# Patient Record
Sex: Male | Born: 1995 | Race: White | Hispanic: No | Marital: Single | Smoking: Former smoker
Health system: Southern US, Community
[De-identification: ages and names within clinical notes are randomized; demographics above are authoritative.]

## PROBLEM LIST (undated history)

## (undated) DIAGNOSIS — R1083 Colic: Secondary | ICD-10-CM

## (undated) DIAGNOSIS — J302 Other seasonal allergic rhinitis: Secondary | ICD-10-CM

## (undated) DIAGNOSIS — K922 Gastrointestinal hemorrhage, unspecified: Secondary | ICD-10-CM

## (undated) DIAGNOSIS — F329 Major depressive disorder, single episode, unspecified: Secondary | ICD-10-CM

## (undated) DIAGNOSIS — F419 Anxiety disorder, unspecified: Secondary | ICD-10-CM

## (undated) DIAGNOSIS — R51 Headache: Secondary | ICD-10-CM

## (undated) DIAGNOSIS — J42 Unspecified chronic bronchitis: Secondary | ICD-10-CM

## (undated) DIAGNOSIS — F32A Depression, unspecified: Secondary | ICD-10-CM

## (undated) HISTORY — DX: Other seasonal allergic rhinitis: J30.2

## (undated) HISTORY — DX: Major depressive disorder, single episode, unspecified: F32.9

## (undated) HISTORY — DX: Colic: R10.83

## (undated) HISTORY — DX: Gastrointestinal hemorrhage, unspecified: K92.2

## (undated) HISTORY — DX: Anxiety disorder, unspecified: F41.9

## (undated) HISTORY — DX: Depression, unspecified: F32.A

## (undated) HISTORY — DX: Unspecified chronic bronchitis: J42

## (undated) HISTORY — DX: Headache: R51

---
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
## 2016-12-11 ENCOUNTER — Ambulatory Visit (INDEPENDENT_AMBULATORY_CARE_PROVIDER_SITE_OTHER): Payer: 59 | Admitting: Internal Medicine

## 2016-12-11 ENCOUNTER — Encounter (INDEPENDENT_AMBULATORY_CARE_PROVIDER_SITE_OTHER): Payer: Self-pay | Admitting: Internal Medicine

## 2016-12-11 VITALS — BP 116/62 | HR 72 | Temp 98.0°F | Ht 70.0 in | Wt 210.0 lb

## 2016-12-11 DIAGNOSIS — R635 Abnormal weight gain: Secondary | ICD-10-CM

## 2016-12-11 DIAGNOSIS — K625 Hemorrhage of anus and rectum: Secondary | ICD-10-CM

## 2016-12-11 LAB — CBC WITH DIFFERENTIAL/PLATELET
Basophils Absolute: 52 cells/uL (ref 0–200)
Basophils Relative: 1 %
EOS ABS: 208 {cells}/uL (ref 15–500)
Eosinophils Relative: 4 %
HCT: 42 % (ref 38.5–50.0)
HEMOGLOBIN: 14.3 g/dL (ref 13.2–17.1)
Lymphocytes Relative: 36 %
Lymphs Abs: 1872 cells/uL (ref 850–3900)
MCH: 30.6 pg (ref 27.0–33.0)
MCHC: 34 g/dL (ref 32.0–36.0)
MCV: 89.7 fL (ref 80.0–100.0)
MPV: 9.5 fL (ref 7.5–12.5)
Monocytes Absolute: 468 cells/uL (ref 200–950)
Monocytes Relative: 9 %
NEUTROS ABS: 2600 {cells}/uL (ref 1500–7800)
Neutrophils Relative %: 50 %
Platelets: 288 10*3/uL (ref 140–400)
RBC: 4.68 MIL/uL (ref 4.20–5.80)
RDW: 13.5 % (ref 11.0–15.0)
WBC: 5.2 10*3/uL (ref 3.8–10.8)

## 2016-12-11 LAB — TSH: TSH: 1.16 mIU/L (ref 0.50–4.30)

## 2016-12-11 NOTE — Patient Instructions (Signed)
3 stool cards home with patient. CBC today 

## 2016-12-11 NOTE — Progress Notes (Addendum)
   Subjective:    Patient ID: Eddie Gutierrez, male    DOB: [birth year], 21 y.o.   MRN: [id number] Wt in 2016 182 HPI Presents today with c/o rectal bleeding. Last seen by Dr. Karilyn Cota in October 2016 Presently being treated for an inflamed prostate.  He tells me every now and then he sees blood. States he see blood about once a week. About two months ago he saw red streaks in his stool. This has been occurring for about 2 months. He has a BM x 2 a day. Has not had to strain to have a BM.  Appetite is good. No weight loss. He has gained from 182 to 210 since 2016. Grandfather had colon cancer at age in his 45s. H. Pylori negative in 2016.  Review of Systems Past Medical History:  Diagnosis Date  . Anxiety   . Bronchitis, chronic (HCC)   . Colic in infants    first 3 months  . Depression    Depressive D/O Nos  . GI bleed    presumed PUD  . Headache(784.0)   . Low birth weight in full term infant with weight unknown   . Seasonal allergies     No past surgical history on file.  No Known Allergies  Current Outpatient Prescriptions on File Prior to Visit  Medication Sig Dispense Refill  . Cetirizine HCl (ZYRTEC ALLERGY PO) Take 1 tablet by mouth.       No current facility-administered medications on file prior to visit.        Objective:   Physical Exam Blood pressure 116/62, pulse 72, temperature 98 F (36.7 C), height  (1.778 m), weight 210 lb (95.3 kg). Alert and oriented. Skin warm and dry. Oral mucosa is moist.   . Sclera anicteric, conjunctivae is pink. Thyroid not enlarged. No cervical lymphadenopathy. Lungs clear. Heart regular rate and rhythm.  Abdomen is soft. Bowel sounds are positive. No hepatomegaly. No abdominal masses felt. No tenderness.  No edema to lower extremities.  Stool brown and guaiac negative.        Assessment & Plan:  Rectal bleeding.  3 stool cards home with patient. CBC  Weight gain: TSH at mother's request.

## 2016-12-29 ENCOUNTER — Telehealth (INDEPENDENT_AMBULATORY_CARE_PROVIDER_SITE_OTHER): Payer: Self-pay | Admitting: *Deleted

## 2016-12-29 NOTE — Telephone Encounter (Signed)
He will come by office and pick up 3 more stool cards

## 2016-12-29 NOTE — Telephone Encounter (Signed)
   Diagnosis:    Result(s)   Card 1:  Negative:     Card 2: Negative:   Card 3: Negative:  Patient was given results in office. He states thatwhen collecting stools he saw blood.    Completed by: Larose Hiresammy Tysheem Accardo , LPN   HEMOCCULT SENSA DEVELOPER: LOT#: [id number]   EXPIRATION DATE: 2020-05   HEMOCCULT SENSA CARD:  ZOX#:[id number]   EXPIRATION DATE: 05/20   CARD CONTROL RESULTS:  POSITIVE: Positive  NEGATIVE: Negative    ADDITIONAL COMMENTS: Forwarded to Terri for review.

## 2017-01-13 ENCOUNTER — Telehealth (INDEPENDENT_AMBULATORY_CARE_PROVIDER_SITE_OTHER): Payer: Self-pay | Admitting: *Deleted

## 2017-01-13 ENCOUNTER — Telehealth (INDEPENDENT_AMBULATORY_CARE_PROVIDER_SITE_OTHER): Payer: Self-pay | Admitting: Internal Medicine

## 2017-01-13 DIAGNOSIS — K625 Hemorrhage of anus and rectum: Secondary | ICD-10-CM

## 2017-01-13 DIAGNOSIS — K6289 Other specified diseases of anus and rectum: Secondary | ICD-10-CM

## 2017-01-13 MED ORDER — HYDROCORTISONE ACE-PRAMOXINE 1-1 % RE FOAM: 1.0000 | Freq: Two times a day (BID) | RECTAL | 0 refills | Status: DC

## 2017-01-13 NOTE — Telephone Encounter (Signed)
I called an Rx for Proctofoam. He will call me in about 2 weeks and let me know how he is doing

## 2017-01-13 NOTE — Telephone Encounter (Signed)
   Diagnosis:    Result(s)   Card 1: Positive: 01/07/2017     Card 2: Negative:      Completed by: Caliya Narine,LPN  HEMOCCULT SENSA DEVELOPER: EAV#:[id number]   EXPIRATION DATE: 2020-05   HEMOCCULT SENSA CARD:  BJY#:[id number]   EXPIRATION DATE: 05/20   CARD CONTROL RESULTS:  POSITIVE:Positive  NEGATIVE: Negative    ADDITIONAL COMMENTS: Patient states that every other BM he feels a scratch on the left side. Not painful. He was made aware of results. Per Camelia Engerri going to review with Dr.Rehman and call patient.

## 2017-01-13 NOTE — Telephone Encounter (Signed)
Am going to send an Rx in for proctofoam. I did not feel a mass when I did the rectal. This area on the left could be a fissure or hemorrhoid. I also advised him to take a stool softener. He will call back in about 2 weeks and let me know how he is doing.

## 2017-01-16 NOTE — Telephone Encounter (Signed)
If he has any more episode of rectal bleeding will consider flexible sigmoidoscopy.

## 2017-04-16 ENCOUNTER — Ambulatory Visit: Payer: Self-pay | Admitting: General Surgery

## 2017-04-21 ENCOUNTER — Encounter: Payer: Self-pay | Admitting: General Surgery

## 2017-04-21 ENCOUNTER — Ambulatory Visit (INDEPENDENT_AMBULATORY_CARE_PROVIDER_SITE_OTHER): Payer: Self-pay | Admitting: General Surgery

## 2017-04-21 VITALS — BP 124/78 | HR 87 | Temp 98.9°F | Resp 18 | Ht 70.0 in | Wt 215.0 lb

## 2017-04-21 DIAGNOSIS — K4090 Unilateral inguinal hernia, without obstruction or gangrene, not specified as recurrent: Secondary | ICD-10-CM

## 2017-04-21 NOTE — Patient Instructions (Signed)

## 2017-04-21 NOTE — Progress Notes (Signed)
Eddie Gutierrez; [id number]; 05/03/1996   HPI Patient is a 21 year old white male who was referred to my care by Dr. Phillips Odor for evaluation and treatment of right groin pain.  He states he was starting to work out approximately 1 month ago and he had an acute onset of right groin pain.  He was seen by his primary care physician and was found to have a right inguinal hernia.  Since that time, he has not had any additional right groin pain.  He currently has 0 pain.  He has lightened his workout.  He denies nausea, vomiting, or swelling in the right groin region. Past Medical History:  Diagnosis Date  . Anxiety   . Bronchitis, chronic (HCC)   . Colic in infants    first 3 months  . Depression    Depressive D/O Nos  . GI bleed    presumed PUD  . Headache(784.0)   . Low birth weight in full term infant with weight unknown   . Seasonal allergies     History reviewed. No pertinent surgical history.  Family History  Problem Relation Age of Onset  . Depression Mother        postpartum depression after births of her two children  . ADD / ADHD Mother   . Depression Maternal Grandmother        takes medication  . ADD / ADHD Father   . OCD Father   . ADD / ADHD Brother   . Alcohol abuse Neg Hx   . Drug abuse Neg Hx   . Anxiety disorder Neg Hx   . Bipolar disorder Neg Hx   . Dementia Neg Hx   . Paranoid behavior Neg Hx   . Schizophrenia Neg Hx   . Seizures Neg Hx   . Sexual abuse Neg Hx   . Physical abuse Neg Hx   . Inflammatory bowel disease Neg Hx   . Colon polyps Neg Hx     Current Outpatient Prescriptions on File Prior to Visit  Medication Sig Dispense Refill  . Cetirizine HCl (ZYRTEC ALLERGY PO) Take 1 tablet by mouth.      . doxycycline (DORYX) 100 MG EC tablet Take 100 mg by mouth 2 (two) times daily.    . hydrocortisone-pramoxine (PROCTOFOAM HC) rectal foam Place 1 applicator rectally 2 (two) times daily. 10 g 0  . meloxicam (MOBIC) 15 MG tablet Take 15 mg by mouth daily.      No current facility-administered medications on file prior to visit.     No Known Allergies  History  Alcohol Use No    History  Smoking Status  . Never Smoker  Smokeless Tobacco  . Former Neurosurgeon  . Quit date: 03/25/2012    Review of Systems  Constitutional: Negative.   HENT: Negative.   Eyes: Negative.   Respiratory: Negative.   Cardiovascular: Negative.   Gastrointestinal: Negative.   Genitourinary: Negative.   Musculoskeletal: Negative.   Skin: Negative.   Neurological: Negative.   Endo/Heme/Allergies: Negative.   Psychiatric/Behavioral: Negative.     Objective   Vitals:   04/21/17 1216  BP: 124/78  Pulse: 87  Resp: 18  Temp: 98.9 F (37.2 C)    Physical Exam  Constitutional: He is oriented to person, place, and time and well-developed, well-nourished, and in no distress.  HENT:  Head: Normocephalic and atraumatic.  Cardiovascular: Normal rate, regular rhythm and normal heart sounds.  Exam reveals no gallop and no friction rub.   No murmur heard. Pulmonary/Chest:  Effort normal and breath sounds normal. No respiratory distress. He has no wheezes. He has no rales.  Abdominal: Soft. Bowel sounds are normal. He exhibits no distension. There is no tenderness. There is no rebound.  Laxity in the right groin region noted.   I could barely detect a right inguinal hernia while patient was straining.  Neurological: He is alert and oriented to person, place, and time.  Skin: Skin is warm and dry.  Vitals reviewed.   Assessment  Right inguinal hernia, asymptomatic Plan   No need for surgical intervention at this time.  Paitent understands and agrees.  Follow up prn.

## 2018-03-31 ENCOUNTER — Ambulatory Visit (INDEPENDENT_AMBULATORY_CARE_PROVIDER_SITE_OTHER): Payer: Self-pay | Admitting: Internal Medicine

## 2018-03-31 ENCOUNTER — Other Ambulatory Visit (INDEPENDENT_AMBULATORY_CARE_PROVIDER_SITE_OTHER): Payer: Self-pay | Admitting: Internal Medicine

## 2018-03-31 ENCOUNTER — Encounter (INDEPENDENT_AMBULATORY_CARE_PROVIDER_SITE_OTHER): Payer: Self-pay | Admitting: Internal Medicine

## 2018-03-31 DIAGNOSIS — K625 Hemorrhage of anus and rectum: Secondary | ICD-10-CM | POA: Insufficient documentation

## 2018-03-31 NOTE — Progress Notes (Signed)
   Subjective:    Patient ID: Eddie Gutierrez, male    DOB: 03/12/1996, 22 y.o.   MRN: [id number]  HPI Here today for f/u. Prior hx of rectal bleeding.  Seen in April of last year for rectal bleeding.  Had been seeing blood for about 2 months.  Family hx of colon cancer in a grandfather in his 7460s. Two stools card were sent home with him and one was positive. . Per Dr. Dr. Patty Sermonsehman's telephone encounter, if patient has another episode of rectal bleeding, will consider flexible sigmoid.  He presents today stating he sees blood with his BMs. Does not occur daily. Occurs every 4th stool. Sees blood on the toilet tissue. Bleeding started a couple of months ago. With his BMs, has some tenderness in his rectum. Marland Kitchen.  He does see blood on one side of the stool in the toilet.  Appetite is okay. No weight loss.      Review of Systems Past Medical History:  Diagnosis Date  . Anxiety   . Bronchitis, chronic (HCC)   . Colic in infants    first 3 months  . Depression    Depressive D/O Nos  . GI bleed    presumed PUD  . Headache(784.0)   . Low birth weight in full term infant with weight unknown   . Seasonal allergies     History reviewed. No pertinent surgical history.  No Known Allergies  No current outpatient medications on file prior to visit.   No current facility-administered medications on file prior to visit.         Objective:   Physical Exam Blood pressure 120/82, pulse 80, temperature 97.6 F (36.4 C), height 5\' 9"  (1.753 m), weight 211 lb 11.2 oz (96 kg). Alert and oriented. Skin warm and dry. Oral mucosa is moist.   . Sclera anicteric, conjunctivae is pink. Thyroid not enlarged. No cervical lymphadenopathy. Lungs clear. Heart regular rate and rhythm.  Abdomen is soft. Bowel sounds are positive. No hepatomegaly. No abdominal masses felt. No tenderness.  No edema to lower extremities.          Assessment & Plan:  Rectal bleeding. Needs flex sigmoid to rule out hemorrhoid,  polyps, fissure and less likely colon cancer.

## 2018-03-31 NOTE — Patient Instructions (Signed)
The risks of bleeding, perforation and infection were reviewed with patient.  

## 2018-04-01 ENCOUNTER — Encounter (INDEPENDENT_AMBULATORY_CARE_PROVIDER_SITE_OTHER): Payer: Self-pay | Admitting: *Deleted

## 2018-04-01 NOTE — Patient Instructions (Signed)
Eddie Gutierrez  04/01/2018     @PREFPERIOPPHARMACY @   Your procedure is scheduled on  04/09/2018   Report to [facility] at  1030   A.M.  Call this number if you have problems the morning of surgery:  [phone]   Remember:  Do not eat or drink after midnight.  You may drink clear liquids until (follow the instructions given to you).  Clear liquids allowed are:                    Water, Juice (non-citric and without pulp), Carbonated beverages, Clear Tea, Black Coffee only, Plain Jell-O only, Gatorade and Plain Popsicles only    Take these medicines the morning of surgery with A SIP OF WATER  None    Do not wear jewelry, make-up or nail polish.  Do not wear lotions, powders, or perfumes, or deodorant.  Do not shave 48 hours prior to surgery.  Men may shave face and neck.  Do not bring valuables to the hospital.  [facility] is not responsible for any belongings or valuables.  Contacts, dentures or bridgework may not be worn into surgery.  Leave your suitcase in the car.  After surgery it may be brought to your room.  For patients admitted to the hospital, discharge time will be determined by your treatment team.  Patients discharged the day of surgery will not be allowed to drive home.   Name and phone number of your driver:   family Special instructions:  Follow the diet and prep instructions given to you by Dr Patty Sermons office.  Please read over the following fact sheets that you were given. Anesthesia Post-op Instructions and Care and Recovery After Surgery       Flexible Sigmoidoscopy Flexible sigmoidoscopy is a procedure to check the lower colon (sigmoid colon). The procedure is done using a short, flexible tube that has a small camera attached (sigmoidoscope). The sigmoidoscope is inserted into the anus and passed through the rectum into the sigmoid colon. The camera on the scope sends images to a TV monitor in the exam room. You may need this exam if  you have had changes in your bowel habits, bleeding from your rectum, or pain in your abdomen. You may also need this test if your health care provider wants to check for abnormal growths in your rectum or lower colon. Tell a health care provider about:  Any allergies you have.  All medicines you are taking, including vitamins, herbs, eye drops, creams, and over-the-counter medicines.  Any problems you or family members have had with anesthetic medicines.  Any blood disorders you have.  Any surgeries you have had.  Any medical conditions you have.  Whether you are pregnant or may be pregnant. What are the risks? Generally, this is a safe procedure. However, problems may occur, including:  Abdominal pain.  Bleeding.  Infection or inflammation in your colon.  A tear through your rectum or colon (perforation).  Allergic reactions to medicines.  Damage to other structures or organs.  What happens before the procedure?  Follow instructions from your health care provider about eating and drinking restrictions.  Ask your health care provider about: ? Changing or stopping your regular medicines. This is especially important if you are taking diabetes medicines or blood thinners. ? Taking medicines such as aspirin and ibuprofen. These medicines can thin your blood. Do not take these medicines before your procedure if your  health care provider instructs you not to.  Plan to have someone take you home from the hospital or clinic.  If you will be going home right after the procedure, plan to have someone with you for 24 hours.  You will need to follow a specific diet and use a laxative or an enema before the procedure (bowel prep). This is to clean out your colon. The bowel prep is often done the night before the procedure or the morning of the procedure. Follow instructions exactly as given by your health care provider.  You may need to have a rectal suppository or enema in the  morning before your procedure. What happens during the procedure?  An IV tube may be inserted into one of your veins.  You may be given a medicine to help you relax (sedative).  You will lie on your side on the exam table. You may be asked to change positions during the procedure.  The sigmoidoscope will be lubricated and gently inserted into your anus.  Air will be injected into your colon, and the sigmoidoscope will be moved into your sigmoid colon. You may feel some pressure or cramping.  Your health care provider will check the monitor for any abnormal findings.  Your health care provider may take a small piece of tissue to check under a microscope (biopsy).  The scope will be slowly removed. The procedure may vary among health care providers and hospitals. What happens after the procedure?  Your blood pressure, heart rate, breathing rate, and blood oxygen level will be monitored until the medicines you were given have worn off.  Do not drive for 24 hours if you received a sedative. This information is not intended to replace advice given to you by your health care provider. Make sure you discuss any questions you have with your health care provider. Document Released: 08/08/2000 Document Revised: 02/29/2016 Document Reviewed: 11/10/2015 Elsevier Interactive Patient Education  2018 [employer].  Flexible Sigmoidoscopy, Care After This sheet gives you information about how to care for yourself after your procedure. Your health care provider may also give you more specific instructions. If you have problems or questions, contact your health care provider. What can I expect after the procedure? After the procedure, it is common to have:  Abdominal cramping or pain.  Bloating.  A small amount of rectal bleeding if you had a biopsy.  Follow these instructions at home:  Take over-the-counter and prescription medicines only as told by your health care provider.  Do not drive  for 24 hours if you received a medicine to help you relax (sedative).  Keep all follow-up visits as told by your health care provider. This is important. Contact a health care provider if:  You have abdominal pain or cramping that gets worse or is not helped with medicine.  You continue to have small amounts of rectal bleeding after 24 hours.  You have nausea or vomiting.  You feel weak or dizzy.  You have a fever. Get help right away if:  You pass large blood clots or see a large amount of blood in the toilet after having a bowel movement.  You have nausea or vomiting for more than 24 hours after the procedure. This information is not intended to replace advice given to you by your health care provider. Make sure you discuss any questions you have with your health care provider. Document Released: 08/16/2013 Document Revised: 02/29/2016 Document Reviewed: 11/10/2015 Elsevier Interactive Patient Education  2018 [employer]  Monitored Anesthesia Care Anesthesia is a term that refers to techniques, procedures, and medicines that help a person stay safe and comfortable during a medical procedure. Monitored anesthesia care, or sedation, is one type of anesthesia. Your anesthesia specialist may recommend sedation if you will be having a procedure that does not require you to be unconscious, such as:  Cataract surgery.  A dental procedure.  A biopsy.  A colonoscopy.  During the procedure, you may receive a medicine to help you relax (sedative). There are three levels of sedation:  Mild sedation. At this level, you may feel awake and relaxed. You will be able to follow directions.  Moderate sedation. At this level, you will be sleepy. You may not remember the procedure.  Deep sedation. At this level, you will be asleep. You will not remember the procedure.  The more medicine you are given, the deeper your level of sedation will be. Depending on how you respond to the procedure,  the anesthesia specialist may change your level of sedation or the type of anesthesia to fit your needs. An anesthesia specialist will monitor you closely during the procedure. Let your health care provider know about:  Any allergies you have.  All medicines you are taking, including vitamins, herbs, eye drops, creams, and over-the-counter medicines.  Any use of steroids (by mouth or as a cream).  Any problems you or family members have had with sedatives and anesthetic medicines.  Any blood disorders you have.  Any surgeries you have had.  Any medical conditions you have, such as sleep apnea.  Whether you are pregnant or may be pregnant.  Any use of cigarettes, alcohol, or street drugs. What are the risks? Generally, this is a safe procedure. However, problems may occur, including:  Getting too much medicine (oversedation).  Nausea.  Allergic reaction to medicines.  Trouble breathing. If this happens, a breathing tube may be used to help with breathing. It will be removed when you are awake and breathing on your own.  Heart trouble.  Lung trouble.  Before the procedure Staying hydrated Follow instructions from your health care provider about hydration, which may include:  Up to 2 hours before the procedure - you may continue to drink clear liquids, such as water, clear fruit juice, black coffee, and plain tea.  Eating and drinking restrictions Follow instructions from your health care provider about eating and drinking, which may include:  8 hours before the procedure - stop eating heavy meals or foods such as meat, fried foods, or fatty foods.  6 hours before the procedure - stop eating light meals or foods, such as toast or cereal.  6 hours before the procedure - stop drinking milk or drinks that contain milk.  2 hours before the procedure - stop drinking clear liquids.  Medicines Ask your health care provider about:  Changing or stopping your regular  medicines. This is especially important if you are taking diabetes medicines or blood thinners.  Taking medicines such as aspirin and ibuprofen. These medicines can thin your blood. Do not take these medicines before your procedure if your health care provider instructs you not to.  Tests and exams  You will have a physical exam.  You may have blood tests done to show: ? How well your kidneys and liver are working. ? How well your blood can clot.  General instructions  Plan to have someone take you home from the hospital or clinic.  If you will be going home  right after the procedure, plan to have someone with you for 24 hours.  What happens during the procedure?  Your blood pressure, heart rate, breathing, level of pain and overall condition will be monitored.  An IV tube will be inserted into one of your veins.  Your anesthesia specialist will give you medicines as needed to keep you comfortable during the procedure. This may mean changing the level of sedation.  The procedure will be performed. After the procedure  Your blood pressure, heart rate, breathing rate, and blood oxygen level will be monitored until the medicines you were given have worn off.  Do not drive for 24 hours if you received a sedative.  You may: ? Feel sleepy, clumsy, or nauseous. ? Feel forgetful about what happened after the procedure. ? Have a sore throat if you had a breathing tube during the procedure. ? Vomit. This information is not intended to replace advice given to you by your health care provider. Make sure you discuss any questions you have with your health care provider. Document Released: 05/07/2005 Document Revised: 01/18/2016 Document Reviewed: 12/02/2015 Elsevier Interactive Patient Education  2018 Else[employer] Inc[employer] Monitored Anesthesia Care, Care After These instructions provide you with information about caring for yourself after your procedure. Your health care provider may also give  you more specific instructions. Your treatment has been planned according to current medical practices, but problems sometimes occur. Call your health care provider if you have any problems or questions after your procedure. What can I expect after the procedure? After your procedure, it is common to:  Feel sleepy for several hours.  Feel clumsy and have poor balance for several hours.  Feel forgetful about what happened after the procedure.  Have poor judgment for several hours.  Feel nauseous or vomit.  Have a sore throat if you had a breathing tube during the procedure.  Follow these instructions at home: For at least 24 hours after the procedure:   Do not: ? Participate in activities in which you could fall or become injured. ? Drive. ? Use heavy machinery. ? Drink alcohol. ? Take sleeping pills or medicines that cause drowsiness. ? Make important decisions or sign legal documents. ? Take care of children on your own.  Rest. Eating and drinking  Follow the diet that is recommended by your health care provider.  If you vomit, drink water, juice, or soup when you can drink without vomiting.  Make sure you have little or no nausea before eating solid foods. General instructions  Have a responsible adult stay with you until you are awake and alert.  Take over-the-counter and prescription medicines only as told by your health care provider.  If you smoke, do not smoke without supervision.  Keep all follow-up visits as told by your health care provider. This is important. Contact a health care provider if:  You keep feeling nauseous or you keep vomiting.  You feel light-headed.  You develop a rash.  You have a fever. Get help right away if:  You have trouble breathing. This information is not intended to replace advice given to you by your health care provider. Make sure you discuss any questions you have with your health care provider. Document Released:  12/02/2015 Document Revised: 04/02/2016 Document Reviewed: 12/02/2015 Elsevier Interactive Patient Education  [employer].

## 2018-04-05 ENCOUNTER — Encounter (HOSPITAL_COMMUNITY): Payer: Self-pay

## 2018-04-05 ENCOUNTER — Encounter (HOSPITAL_COMMUNITY)
Admission: RE | Admit: 2018-04-05 | Discharge: 2018-04-05 | Disposition: A | Discharge status: Home / Self Care | Payer: Self-pay | Source: Ambulatory Visit | Attending: Internal Medicine | Admitting: Internal Medicine

## 2018-04-05 ENCOUNTER — Other Ambulatory Visit: Payer: Self-pay

## 2018-04-05 DIAGNOSIS — Z01812 Encounter for preprocedural laboratory examination: Secondary | ICD-10-CM | POA: Insufficient documentation

## 2018-04-05 DIAGNOSIS — K625 Hemorrhage of anus and rectum: Secondary | ICD-10-CM

## 2018-04-05 LAB — CBC WITH DIFFERENTIAL/PLATELET
BASOS ABS: 0 10*3/uL (ref 0.0–0.1)
Basophils Relative: 0 %
EOS PCT: 3 %
Eosinophils Absolute: 0.2 10*3/uL (ref 0.0–0.7)
HCT: 44.8 % (ref 39.0–52.0)
Hemoglobin: 15.5 g/dL (ref 13.0–17.0)
LYMPHS ABS: 2.4 10*3/uL (ref 0.7–4.0)
Lymphocytes Relative: 32 %
MCH: 31.4 pg (ref 26.0–34.0)
MCHC: 34.6 g/dL (ref 30.0–36.0)
MCV: 90.9 fL (ref 78.0–100.0)
MONO ABS: 0.5 10*3/uL (ref 0.1–1.0)
MONOS PCT: 6 %
Neutro Abs: 4.3 10*3/uL (ref 1.7–7.7)
Neutrophils Relative %: 59 %
PLATELETS: 277 10*3/uL (ref 150–400)
RBC: 4.93 MIL/uL (ref 4.22–5.81)
RDW: 12.4 % (ref 11.5–15.5)
WBC: 7.3 10*3/uL (ref 4.0–10.5)

## 2018-04-09 ENCOUNTER — Ambulatory Visit (HOSPITAL_COMMUNITY): Payer: Self-pay | Admitting: Anesthesiology

## 2018-04-09 ENCOUNTER — Encounter (HOSPITAL_COMMUNITY): Payer: Self-pay

## 2018-04-09 ENCOUNTER — Encounter (HOSPITAL_COMMUNITY)
Admission: RE | Disposition: A | Discharge status: Home / Self Care | Payer: Self-pay | Source: Ambulatory Visit | Attending: Internal Medicine

## 2018-04-09 ENCOUNTER — Ambulatory Visit (HOSPITAL_COMMUNITY)
Admission: RE | Admit: 2018-04-09 | Discharge: 2018-04-09 | Disposition: A | Discharge status: Home / Self Care | Payer: Self-pay | Source: Ambulatory Visit | Attending: Internal Medicine | Admitting: Internal Medicine

## 2018-04-09 DIAGNOSIS — K648 Other hemorrhoids: Secondary | ICD-10-CM

## 2018-04-09 DIAGNOSIS — K626 Ulcer of anus and rectum: Secondary | ICD-10-CM

## 2018-04-09 DIAGNOSIS — F419 Anxiety disorder, unspecified: Secondary | ICD-10-CM | POA: Insufficient documentation

## 2018-04-09 DIAGNOSIS — F429 Obsessive-compulsive disorder, unspecified: Secondary | ICD-10-CM | POA: Insufficient documentation

## 2018-04-09 DIAGNOSIS — K625 Hemorrhage of anus and rectum: Secondary | ICD-10-CM

## 2018-04-09 DIAGNOSIS — Z79899 Other long term (current) drug therapy: Secondary | ICD-10-CM | POA: Insufficient documentation

## 2018-04-09 DIAGNOSIS — F329 Major depressive disorder, single episode, unspecified: Secondary | ICD-10-CM | POA: Insufficient documentation

## 2018-04-09 DIAGNOSIS — K921 Melena: Secondary | ICD-10-CM

## 2018-04-09 DIAGNOSIS — Z87891 Personal history of nicotine dependence: Secondary | ICD-10-CM | POA: Insufficient documentation

## 2018-04-09 HISTORY — PX: FLEXIBLE SIGMOIDOSCOPY: SHX5431

## 2018-04-09 HISTORY — PX: BIOPSY: SHX5522

## 2018-04-09 SURGERY — SIGMOIDOSCOPY, FLEXIBLE
Anesthesia: Monitor Anesthesia Care

## 2018-04-09 MED ORDER — CHLORHEXIDINE GLUCONATE CLOTH 2 % EX PADS: 6.0000 | MEDICATED_PAD | Freq: Once | CUTANEOUS | Status: DC

## 2018-04-09 MED ORDER — FLEET ENEMA 7-19 GM/118ML RE ENEM
1.0000 | ENEMA | Freq: Once | RECTAL | Status: AC
Start: 2018-04-09 — End: 2018-04-09
  Administered 2018-04-09: 1 via RECTAL
  Filled 2018-04-09: qty 1

## 2018-04-09 MED ORDER — PROPOFOL 500 MG/50ML IV EMUL
INTRAVENOUS | Status: DC | PRN
  Administered 2018-04-09: 150 ug/kg/min via INTRAVENOUS

## 2018-04-09 MED ORDER — LACTATED RINGERS IV SOLN
INTRAVENOUS | Status: DC
  Administered 2018-04-09: 08:00:00 via INTRAVENOUS

## 2018-04-09 MED ORDER — MIDAZOLAM HCL 5 MG/5ML IJ SOLN
INTRAMUSCULAR | Status: DC | PRN
  Administered 2018-04-09: 2 mg via INTRAVENOUS

## 2018-04-09 MED ORDER — LACTATED RINGERS IV SOLN
INTRAVENOUS | Status: DC | PRN
  Administered 2018-04-09: 07:00:00 via INTRAVENOUS

## 2018-04-09 NOTE — Op Note (Signed)
[facility] Patient Name: Eddie Gutierrez Procedure Date: 04/09/2018 7:38 AM MRN: [id number] Date of Birth: 12/20/1995 Attending MD: Lionel DecemberNajeeb Markale Birdsell , MD CSN: [id number] Age: [age] Admit Type: Outpatient Procedure:                Flexible Sigmoidoscopy Indications:              Hematochezia Providers:                Lionel DecemberNajeeb Shayna Eblen, MD, Criselda PeachesLurae B. Patsy LagerAlbert RN, RN, Edythe ClarityKelly                            Cox, Technician Referring MD:             Corrie MckusickJohn C. Golding MD, MD Medicines:                Propofol per Anesthesia Complications:            No immediate complications. Estimated Blood Loss:     Estimated blood loss was minimal. Procedure:                Pre-Anesthesia Assessment:                           - Prior to the procedure, a History and Physical                            was performed, and patient medications and                            allergies were reviewed. The patient's tolerance of                            previous anesthesia was also reviewed. The risks                            and benefits of the procedure and the sedation                            options and risks were discussed with the patient.                            All questions were answered, and informed consent                            was obtained. Prior Anticoagulants: The patient                            last took ibuprofen 3 days prior to the procedure.                            ASA Grade Assessment: I - A normal, healthy                            patient. After reviewing the risks and benefits,  the patient was deemed in satisfactory condition to                            undergo the procedure.                           After obtaining informed consent, the scope was                            passed under direct vision. The PCF-[id number]                            ([id number]) was introduced through the and advanced                            to the the left transverse colon.  The flexible                            sigmoidoscopy was accomplished without difficulty.                            The patient tolerated the procedure well. The                            quality of the bowel preparation was excellent. Scope In: 7:49:56 AM Scope Out: 7:58:07 AM Total Procedure Duration: 0 hours 8 minutes 11 seconds  Findings:      The perianal and digital rectal examinations were normal.      The recto-sigmoid colon, sigmoid colon, descending colon, splenic       flexure and distal transverse colon appeared normal.      Multiple non-bleeding erosions were found in the rectum. No stigmata of       recent bleeding were seen. Biopsies were taken with a cold forceps for       histology. The pathology specimen was placed into Bottle Number 1.      Internal hemorrhoids were found during retroflexion. The hemorrhoids       were small. Impression:               - The recto-sigmoid colon, sigmoid colon,                            descending colon, splenic flexure and distal                            transverse colon are normal.                           - Multiple erosions in the rectum. Biopsied.                           - Internal hemorrhoids. Moderate Sedation:      Per Anesthesia Care Recommendation:           - Discharge patient to home (with parent).                           -  Resume previous diet today.                           - Continue present medications.                           - No aspirin, ibuprofen, naproxen, or other                            non-steroidal anti-inflammatory drugs.                           - Await pathology results. Procedure Code(s):        --- Professional ---                           [phone], Sigmoidoscopy, flexible; with biopsy, single                            or multiple Diagnosis Code(s):        --- Professional ---                           K64.8, Other hemorrhoids                           K62.6, Ulcer of anus and rectum                            K92.1, Melena (includes Hematochezia) CPT copyright 2017 American Medical Association. All rights reserved. The codes documented in this report are preliminary and upon coder review may  be revised to meet current compliance requirements. Lionel December, MD Lionel December, MD 04/09/2018 8:07:44 AM This report has been signed electronically. Number of Addenda: 0

## 2018-04-09 NOTE — Transfer of Care (Signed)
Immediate Anesthesia Transfer of Care Note  Patient: Eddie Gutierrez  Procedure(s) Performed: FLEXIBLE SIGMOIDOSCOPY WITH PROPOFOL (N/A ) BIOPSY  Patient Location: PACU  Anesthesia Type:MAC  Level of Consciousness: awake and patient cooperative  Airway & Oxygen Therapy: Patient Spontanous Breathing  Post-op Assessment: Report given to RN, Post -op Vital signs reviewed and stable and Patient moving all extremities  Post vital signs: Reviewed and stable  Last Vitals:  Vitals Value Taken Time  BP 123/79 04/09/2018  8:06 AM  Temp    Pulse 95 04/09/2018  8:07 AM  Resp 14 04/09/2018  8:07 AM  SpO2 99 % 04/09/2018  8:07 AM  Vitals shown include unvalidated device data.  Last Pain:  Vitals:   04/09/18 0741  TempSrc:   PainSc: 0-No pain         Complications: No apparent anesthesia complications

## 2018-04-09 NOTE — Discharge Instructions (Signed)
Resume usual medications as before. Do not take ibuprofen. Resume usual diet. No driving for 24 hours. Physician will call with biopsy results and further recommendations.   Flexible Sigmoidoscopy, Care After This sheet gives you information about how to care for yourself after your procedure. Your health care provider may also give you more specific instructions. If you have problems or questions, contact your health care provider. What can I expect after the procedure? After the procedure, it is common to have:  Abdominal cramping or pain.  Bloating.  A small amount of rectal bleeding if you had a biopsy.  Follow these instructions at home:  Take over-the-counter and prescription medicines only as told by your health care provider.  Do not drive for 24 hours if you received a medicine to help you relax (sedative).  Keep all follow-up visits as told by your health care provider. This is important. Contact a health care provider if:  You have abdominal pain or cramping that gets worse or is not helped with medicine.  You continue to have small amounts of rectal bleeding after 24 hours.  You have nausea or vomiting.  You feel weak or dizzy.  You have a fever. Get help right away if:  You pass large blood clots or see a large amount of blood in the toilet after having a bowel movement.  You have nausea or vomiting for more than 24 hours after the procedure. This information is not intended to replace advice given to you by your health care provider. Make sure you discuss any questions you have with your health care provider. Document Released: 08/16/2013 Document Revised: 02/29/2016 Document Reviewed: 11/10/2015 Elsevier Interactive Patient Education  2018 [employer]    Monitored Anesthesia Care Anesthesia is a term that refers to techniques, procedures, and medicines that help a person stay safe and comfortable during a medical procedure. Monitored anesthesia care,  or sedation, is one type of anesthesia. Your anesthesia specialist may recommend sedation if you will be having a procedure that does not require you to be unconscious, such as:  Cataract surgery.  A dental procedure.  A biopsy.  A colonoscopy.  During the procedure, you may receive a medicine to help you relax (sedative). There are three levels of sedation:  Mild sedation. At this level, you may feel awake and relaxed. You will be able to follow directions.  Moderate sedation. At this level, you will be sleepy. You may not remember the procedure.  Deep sedation. At this level, you will be asleep. You will not remember the procedure.  The more medicine you are given, the deeper your level of sedation will be. Depending on how you respond to the procedure, the anesthesia specialist may change your level of sedation or the type of anesthesia to fit your needs. An anesthesia specialist will monitor you closely during the procedure. Let your health care provider know about:  Any allergies you have.  All medicines you are taking, including vitamins, herbs, eye drops, creams, and over-the-counter medicines.  Any use of steroids (by mouth or as a cream).  Any problems you or family members have had with sedatives and anesthetic medicines.  Any blood disorders you have.  Any surgeries you have had.  Any medical conditions you have, such as sleep apnea.  Whether you are pregnant or may be pregnant.  Any use of cigarettes, alcohol, or street drugs. What are the risks? Generally, this is a safe procedure. However, problems may occur, including:  Getting too  much medicine (oversedation).  Nausea.  Allergic reaction to medicines.  Trouble breathing. If this happens, a breathing tube may be used to help with breathing. It will be removed when you are awake and breathing on your own.  Heart trouble.  Lung trouble.  Before the procedure Staying hydrated Follow instructions from  your health care provider about hydration, which may include:  Up to 2 hours before the procedure - you may continue to drink clear liquids, such as water, clear fruit juice, black coffee, and plain tea.  Eating and drinking restrictions Follow instructions from your health care provider about eating and drinking, which may include:  8 hours before the procedure - stop eating heavy meals or foods such as meat, fried foods, or fatty foods.  6 hours before the procedure - stop eating light meals or foods, such as toast or cereal.  6 hours before the procedure - stop drinking milk or drinks that contain milk.  2 hours before the procedure - stop drinking clear liquids.  Medicines Ask your health care provider about:  Changing or stopping your regular medicines. This is especially important if you are taking diabetes medicines or blood thinners.  Taking medicines such as aspirin and ibuprofen. These medicines can thin your blood. Do not take these medicines before your procedure if your health care provider instructs you not to.  Tests and exams  You will have a physical exam.  You may have blood tests done to show: ? How well your kidneys and liver are working. ? How well your blood can clot.  General instructions  Plan to have someone take you home from the hospital or clinic.  If you will be going home right after the procedure, plan to have someone with you for 24 hours.  What happens during the procedure?  Your blood pressure, heart rate, breathing, level of pain and overall condition will be monitored.  An IV tube will be inserted into one of your veins.  Your anesthesia specialist will give you medicines as needed to keep you comfortable during the procedure. This may mean changing the level of sedation.  The procedure will be performed. After the procedure  Your blood pressure, heart rate, breathing rate, and blood oxygen level will be monitored until the medicines  you were given have worn off.  Do not drive for 24 hours if you received a sedative.  You may: ? Feel sleepy, clumsy, or nauseous. ? Feel forgetful about what happened after the procedure. ? Have a sore throat if you had a breathing tube during the procedure. ? Vomit. This information is not intended to replace advice given to you by your health care provider. Make sure you discuss any questions you have with your health care provider. Document Released: 05/07/2005 Document Revised: 01/18/2016 Document Reviewed: 12/02/2015 Elsevier Interactive Patient Education  [employer]2018 [employer] Inc[employer]

## 2018-04-09 NOTE — Anesthesia Preprocedure Evaluation (Signed)
Anesthesia Evaluation  Patient identified by MRN, date of birth, ID band Patient awake    Reviewed: Allergy & Precautions, H&P , NPO status , Patient's Chart, lab work & pertinent test results  Airway Mallampati: II  TM Distance: >3 FB Neck ROM: full    Dental no notable dental hx.    Pulmonary neg pulmonary ROS, former smoker,    Pulmonary exam normal breath sounds clear to auscultation       Cardiovascular Exercise Tolerance: Good negative cardio ROS   Rhythm:regular Rate:Normal     Neuro/Psych  Headaches, PSYCHIATRIC DISORDERS Anxiety Depression ADD (attention deficit disorder) OCD (obsessive compulsive disorder)  negative neurological ROS  negative psych ROS   GI/Hepatic negative GI ROS, Neg liver ROS,   Endo/Other  negative endocrine ROS  Renal/GU negative Renal ROS  negative genitourinary   Musculoskeletal   Abdominal   Peds  Hematology negative hematology ROS (+)   Anesthesia Other Findings   Reproductive/Obstetrics negative OB ROS                             Anesthesia Physical Anesthesia Plan  ASA: II  Anesthesia Plan: MAC   Post-op Pain Management:    Induction:   PONV Risk Score and Plan:   Airway Management Planned:   Additional Equipment:   Intra-op Plan:   Post-operative Plan:   Informed Consent: I have reviewed the patients History and Physical, chart, labs and discussed the procedure including the risks, benefits and alternatives for the proposed anesthesia with the patient or authorized representative who has indicated his/her understanding and acceptance.     Plan Discussed with: CRNA  Anesthesia Plan Comments:         Anesthesia Quick Evaluation

## 2018-04-09 NOTE — H&P (Signed)
Eddie Gutierrez is an 22 y.o. male.   Chief Complaint: Patient is here for flexible sigmoidoscopy. HPI: Patient is 22 year old Caucasian male who presents with history of hematochezia which is been occurring intermittently over the last 2 years.  He states he has not had any bleeding in over 2 weeks.  When this occurs he is able to feel it.  He does not have any sharp pain.  His bowels move daily.  He is not like clockwork but he rarely feels constipated.  He also denies diarrhea or abdominal pain.  He has good appetite.  He has been watching diet in order to lose weight. He takes OTC antiallergic on as-needed basis. Family history is negative for IBD but maternal grandmother was diagnosed with colon carcinoma and later years.  Past Medical History:  Diagnosis Date  . Anxiety   . Bronchitis, chronic (HCC)   . Colic in infants    first 3 months  . Depression    Depressive D/O Nos  . GI bleed    presumed PUD  . Headache(784.0)   . Low birth weight in full term infant with weight unknown   . Seasonal allergies     History reviewed. No pertinent surgical history.  Family History  Problem Relation Age of Onset  . Depression Mother        postpartum depression after births of her two children  . ADD / ADHD Mother   . Depression Maternal Grandmother        takes medication  . ADD / ADHD Father   . OCD Father   . ADD / ADHD Brother   . Alcohol abuse Neg Hx   . Drug abuse Neg Hx   . Anxiety disorder Neg Hx   . Bipolar disorder Neg Hx   . Dementia Neg Hx   . Paranoid behavior Neg Hx   . Schizophrenia Neg Hx   . Seizures Neg Hx   . Sexual abuse Neg Hx   . Physical abuse Neg Hx   . Inflammatory bowel disease Neg Hx   . Colon polyps Neg Hx    Social History:  reports that he quit smoking about a year ago. His smoking use included cigarettes. He has a 0.25 pack-year smoking history. He quit smokeless tobacco use about 6 years ago. He reports that he does not drink alcohol or use  drugs.  Allergies: No Known Allergies  Medications Prior to Admission  Medication Sig Dispense Refill  . cetirizine (ZYRTEC) 10 MG tablet Take 10 mg by mouth daily as needed for allergies.    . Clotrimazole (JOCK ITCH EX) Apply 1 application topically daily as needed (jock itch).    Marland Kitchen. ibuprofen (ADVIL,MOTRIN) 200 MG tablet Take 400-600 mg by mouth 2 (two) times daily as needed for headache or moderate pain.    . Pseudoeph-Doxylamine-DM-APAP (NYQUIL PO) Take 1 Dose by mouth at bedtime as needed (sleep).    . Terbinafine HCl (ATHLETES FOOT EX) Apply 1 application topically daily as needed (athletes foot).      No results found for this or any previous visit (from the past 48 hour(s)). No results found.  ROS  Blood pressure 120/84, pulse (!) 103, temperature 98 F (36.7 C), temperature source Oral, resp. rate 13, SpO2 98 %. Physical Exam  Constitutional: He appears well-developed and well-nourished.  HENT:  Mouth/Throat: Oropharynx is clear and moist.  Eyes: Conjunctivae are normal. No scleral icterus.  Neck: No thyromegaly present.  Cardiovascular: Normal rate and regular rhythm.  No murmur heard. Respiratory: Effort normal and breath sounds normal.  GI: Soft. He exhibits no distension and no mass. There is no tenderness.  Lymphadenopathy:    He has no cervical adenopathy.     Assessment/Plan Hematochezia. Agnostic flexible sigmoidoscopy.  Lionel DecemberNajeeb Javarion Douty, MD 04/09/2018, 7:15 AM

## 2018-04-09 NOTE — Anesthesia Postprocedure Evaluation (Signed)
Anesthesia Post Note  Patient: Eddie Gutierrez  Procedure(s) Performed: FLEXIBLE SIGMOIDOSCOPY WITH PROPOFOL (N/A ) BIOPSY  Patient location during evaluation: PACU Anesthesia Type: MAC Level of consciousness: awake and alert and patient cooperative Pain management: pain level controlled Vital Signs Assessment: post-procedure vital signs reviewed and stable Respiratory status: spontaneous breathing, nonlabored ventilation and respiratory function stable Cardiovascular status: blood pressure returned to baseline Postop Assessment: no apparent nausea or vomiting Anesthetic complications: no     Last Vitals:  Vitals:   04/09/18 0700 04/09/18 0806  BP: 120/84   Pulse:    Resp: 13   Temp:  (P) 36.8 C  SpO2: 98%     Last Pain:  Vitals:   04/09/18 0741  TempSrc:   PainSc: 0-No pain                 Siddarth Hsiung J

## 2018-04-13 ENCOUNTER — Other Ambulatory Visit (INDEPENDENT_AMBULATORY_CARE_PROVIDER_SITE_OTHER): Payer: Self-pay | Admitting: Internal Medicine

## 2018-04-13 MED ORDER — MESALAMINE 1000 MG RE SUPP: 1000.0000 mg | Freq: Every day | RECTAL | 0 refills | Status: DC

## 2018-04-14 ENCOUNTER — Encounter (HOSPITAL_COMMUNITY): Payer: Self-pay | Admitting: Internal Medicine

## 2018-05-12 ENCOUNTER — Telehealth (INDEPENDENT_AMBULATORY_CARE_PROVIDER_SITE_OTHER): Payer: Self-pay | Admitting: Internal Medicine

## 2018-05-19 NOTE — Telephone Encounter (Signed)
err

## 2018-07-15 ENCOUNTER — Encounter (INDEPENDENT_AMBULATORY_CARE_PROVIDER_SITE_OTHER): Payer: Self-pay | Admitting: Internal Medicine

## 2018-07-15 ENCOUNTER — Ambulatory Visit (INDEPENDENT_AMBULATORY_CARE_PROVIDER_SITE_OTHER): Payer: Self-pay | Admitting: Internal Medicine

## 2018-07-15 VITALS — BP 130/70 | HR 82 | Temp 98.4°F | Ht 70.0 in | Wt 190.2 lb

## 2018-07-15 DIAGNOSIS — K625 Hemorrhage of anus and rectum: Secondary | ICD-10-CM

## 2018-07-15 MED ORDER — MESALAMINE 1000 MG RE SUPP: 1000.0000 mg | Freq: Every day | RECTAL | 0 refills | Status: DC

## 2018-07-15 NOTE — Progress Notes (Signed)
   Subjective:    Patient ID: Eddie Gutierrez, male    DOB: 04/27/1996, 22 y.o.   MRN: [id number]  HPI Here today for f/u. Last seen in August of this year. Hx of rectal bleeding. Underwent a flexible sigmoid which revealed multiple erosions in the rectum. Internal hemorrhoids.  Rectosigmoid, sigmoid, splenic flexure and distal transverse colon normal. Biopsy: colonic mucosa with focal superfical erosion. Hemorrhage and minimal inflammation.  Started on Canasa supp. X 30 days.  He said the supp helped for a while.  He said a large BM and he had some rectal bleeding. Has rectal bleeding off and on for about 3 weeks. He is not using a stool softener. His stools are hard when he sees the rectal bleeding.  Appetite has remained good. 20 pound weight loss which was intentional.  Review of Systems Past Medical History:  Diagnosis Date  . Anxiety   . Bronchitis, chronic (HCC)   . Colic in infants    first 3 months  . Depression    Depressive D/O Nos  . GI bleed    presumed PUD  . Headache(784.0)   . Low birth weight in full term infant with weight unknown   . Seasonal allergies     Past Surgical History:  Procedure Laterality Date  . BIOPSY  04/09/2018   Procedure: BIOPSY;  Surgeon: Malissa Hippoehman, Najeeb U, MD;  Location: [facility];  Service: Endoscopy;;  rectum  . FLEXIBLE SIGMOIDOSCOPY N/A 04/09/2018   Procedure: FLEXIBLE SIGMOIDOSCOPY WITH PROPOFOL;  Surgeon: Malissa Hippoehman, Najeeb U, MD;  Location: [facility];  Service: Endoscopy;  Laterality: N/A;  12:00    No Known Allergies  Current Outpatient Medications on File Prior to Visit  Medication Sig Dispense Refill  . cetirizine (ZYRTEC) 10 MG tablet Take 10 mg by mouth daily as needed for allergies.    . Clotrimazole (JOCK ITCH EX) Apply 1 application topically daily as needed (jock itch).    . Pseudoeph-Doxylamine-DM-APAP (NYQUIL PO) Take 1 Dose by mouth at bedtime as needed (sleep).    . Terbinafine HCl (ATHLETES FOOT EX) Apply 1  application topically daily as needed (athletes foot).     No current facility-administered medications on file prior to visit.         Objective:   Physical Exam Blood pressure 130/70, pulse 82, temperature 98.4 F (36.9 C), height 5\' 10"  (1.778 m), weight 190 lb 3.2 oz (86.3 kg).  Alert and oriented. Skin warm and dry. Oral mucosa is moist.   . Sclera anicteric, conjunctivae is pink. Thyroid not enlarged. No cervical lymphadenopathy. Lungs clear. Heart regular rate and rhythm.  Abdomen is soft. Bowel sounds are positive. No hepatomegaly. No abdominal masses felt. No tenderness.  No edema to lower extremities.         Assessment & Plan:  Rectal bleeding. Recent sigmoid showed erosions in the rectum. I discussed with Dr Karilyn Cotaehman.  Canasa x 30 days.  If rectal bleeding reoccurs after this round of suppositories, will need another sigmoidoscopy  Stool softener.

## 2018-07-15 NOTE — Patient Instructions (Addendum)
Rx for Canasa supp sent to his pharmacy. Stool softener OV in 2 months.

## 2018-09-03 ENCOUNTER — Other Ambulatory Visit (INDEPENDENT_AMBULATORY_CARE_PROVIDER_SITE_OTHER): Payer: Self-pay | Admitting: Internal Medicine

## 2018-09-15 ENCOUNTER — Ambulatory Visit (INDEPENDENT_AMBULATORY_CARE_PROVIDER_SITE_OTHER): Payer: Self-pay | Admitting: Internal Medicine

## 2018-10-06 ENCOUNTER — Encounter (INDEPENDENT_AMBULATORY_CARE_PROVIDER_SITE_OTHER): Payer: Self-pay | Admitting: Internal Medicine

## 2018-10-06 ENCOUNTER — Ambulatory Visit (INDEPENDENT_AMBULATORY_CARE_PROVIDER_SITE_OTHER): Payer: Self-pay | Admitting: Internal Medicine

## 2018-10-06 DIAGNOSIS — K625 Hemorrhage of anus and rectum: Secondary | ICD-10-CM

## 2018-10-06 MED ORDER — MESALAMINE 1000 MG RE SUPP: 1000.0000 mg | Freq: Every day | RECTAL | 0 refills | Status: DC

## 2018-10-06 NOTE — Patient Instructions (Signed)
OV 1 year. 

## 2018-10-06 NOTE — Progress Notes (Signed)
   Subjective:    Patient ID: Eddie Gutierrez, male    DOB: 02/28/1996, 23 y.o.   MRN: [id number]  HPI Here today for f/u.Hx of rectal bleeding.  Underwent a flex sigmoid in August of last year which revealed: Impression:               - The recto-sigmoid colon, sigmoid colon,                            descending colon, splenic flexure and distal                            transverse colon are normal.                           - Multiple erosions in the rectum. Biopsied.                           - Internal hemorrhoids.  Biopsy: Colonic mucosa with focal superficial erosions, hemorrhage and minimal inflammation.  Presents today with c/o rectal bleeding. Symptoms for a couple of weeks. Has not bleed in 2 weeks. He tells me he is doing okay. He has changed in his eating habits. Eating more vegetables.   Review of Systems Past Medical History:  Diagnosis Date  . Anxiety   . Bronchitis, chronic (HCC)   . Colic in infants    first 3 months  . Depression    Depressive D/O Nos  . GI bleed    presumed PUD  . Headache(784.0)   . Low birth weight in full term infant with weight unknown   . Seasonal allergies     Past Surgical History:  Procedure Laterality Date  . BIOPSY  04/09/2018   Procedure: BIOPSY;  Surgeon: Malissa Hippoehman, Najeeb U, MD;  Location: [facility];  Service: Endoscopy;;  rectum  . FLEXIBLE SIGMOIDOSCOPY N/A 04/09/2018   Procedure: FLEXIBLE SIGMOIDOSCOPY WITH PROPOFOL;  Surgeon: Malissa Hippoehman, Najeeb U, MD;  Location: [facility];  Service: Endoscopy;  Laterality: N/A;  12:00    No Known Allergies  Current Outpatient Medications on File Prior to Visit  Medication Sig Dispense Refill  . cetirizine (ZYRTEC) 10 MG tablet Take 10 mg by mouth daily as needed for allergies.    . Clotrimazole (JOCK ITCH EX) Apply 1 application topically daily as needed (jock itch).    . hydrocortisone (ANUSOL-HC) 25 MG suppository ONE SUPPOSITORY RECTALLY AT BEDTIME FOR 2 WEEKS. 14 suppository 0  .  Pseudoeph-Doxylamine-DM-APAP (NYQUIL PO) Take 1 Dose by mouth at bedtime as needed (sleep).    . Terbinafine HCl (ATHLETES FOOT EX) Apply 1 application topically daily as needed (athletes foot).    . mesalamine (CANASA) 1000 MG suppository Place 1 suppository (1,000 mg total) rectally at bedtime. (Patient not taking: Reported on 10/06/2018) 30 suppository 0   No current facility-administered medications on file prior to visit.         Objective:   Physical Exam Blood pressure 124/81, pulse 80, temperature 98 F (36.7 C), height 5\' 10"  (1.778 m), weight 194 lb 8 oz (88.2 kg).        Assessment & Plan:  Rectal bleeding . Canasa supp 1 per rectum x 30days. OV in 3 months.

## 2018-10-20 ENCOUNTER — Ambulatory Visit (INDEPENDENT_AMBULATORY_CARE_PROVIDER_SITE_OTHER): Payer: Self-pay | Admitting: Internal Medicine

## 2018-10-28 ENCOUNTER — Other Ambulatory Visit (INDEPENDENT_AMBULATORY_CARE_PROVIDER_SITE_OTHER): Payer: Self-pay | Admitting: Internal Medicine

## 2019-04-19 ENCOUNTER — Ambulatory Visit (INDEPENDENT_AMBULATORY_CARE_PROVIDER_SITE_OTHER): Payer: Self-pay | Admitting: General Surgery

## 2019-04-19 ENCOUNTER — Encounter: Payer: Self-pay | Admitting: General Surgery

## 2019-04-19 ENCOUNTER — Other Ambulatory Visit: Payer: Self-pay

## 2019-04-19 VITALS — BP 102/70 | HR 68 | Temp 98.0°F | Resp 16 | Ht 70.0 in | Wt 191.0 lb

## 2019-04-19 DIAGNOSIS — K4090 Unilateral inguinal hernia, without obstruction or gangrene, not specified as recurrent: Secondary | ICD-10-CM

## 2019-04-19 NOTE — Progress Notes (Signed)
Eddie Gutierrez; [id number]; 06/03/1996   HPI Patient is a 23-year-old white male who I last saw in 2018 for an inguinal hernia who now presents back with worsening pain and swelling in the groin regions, but primarily the right side.  It is made worse with straining.  No nausea or vomiting have been noted.  The pain does radiate slightly to the testicles.   Past Medical History:  Diagnosis Date  . Anxiety   . Bronchitis, chronic (HCC)   . Colic in infants    first 3 months  . Depression    Depressive D/O Nos  . GI bleed    presumed PUD  . Headache(784.0)   . Low birth weight in full term infant with weight unknown   . Seasonal allergies     Past Surgical History:  Procedure Laterality Date  . BIOPSY  04/09/2018   Procedure: BIOPSY;  Surgeon: Rehman, Najeeb U, MD;  Location: [facility];  Service: Endoscopy;;  rectum  . FLEXIBLE SIGMOIDOSCOPY N/A 04/09/2018   Procedure: FLEXIBLE SIGMOIDOSCOPY WITH PROPOFOL;  Surgeon: Rehman, Najeeb U, MD;  Location: [facility];  Service: Endoscopy;  Laterality: N/A;  12:00    Family History  Problem Relation Age of Onset  . Depression Mother        postpartum depression after births of her two children  . ADD / ADHD Mother   . Depression Maternal Grandmother        takes medication  . ADD / ADHD Father   . OCD Father   . ADD / ADHD Brother   . Alcohol abuse Neg Hx   . Drug abuse Neg Hx   . Anxiety disorder Neg Hx   . Bipolar disorder Neg Hx   . Dementia Neg Hx   . Paranoid behavior Neg Hx   . Schizophrenia Neg Hx   . Seizures Neg Hx   . Sexual abuse Neg Hx   . Physical abuse Neg Hx   . Inflammatory bowel disease Neg Hx   . Colon polyps Neg Hx     Current Outpatient Medications on File Prior to Visit  Medication Sig Dispense Refill  . cetirizine (ZYRTEC) 10 MG tablet Take 10 mg by mouth daily as needed for allergies.    . Clotrimazole (JOCK ITCH EX) Apply 1 application topically daily as needed (jock itch).    . hydrocortisone  (ANUSOL-HC) 25 MG suppository INSERT 1 SUPPOSITORY RECTALLY EVERY DAY AT BEDTIME X14 DAYS 14 suppository 0  . mesalamine (CANASA) 1000 MG suppository Place 1 suppository (1,000 mg total) rectally at bedtime. 30 suppository 0  . Pseudoeph-Doxylamine-DM-APAP (NYQUIL PO) Take 1 Dose by mouth at bedtime as needed (sleep).    . Terbinafine HCl (ATHLETES FOOT EX) Apply 1 application topically daily as needed (athletes foot).     No current facility-administered medications on file prior to visit.     No Known Allergies  Social History   Substance and Sexual Activity  Alcohol Use No  . Alcohol/week: 0.0 standard drinks    Social History   Tobacco Use  Smoking Status Former Smoker  . Packs/day: 0.25  . Years: 1.00  . Pack years: 0.25  . Types: Cigarettes  . Quit date: 04/05/2017  . Years since quitting: 2.0  Smokeless Tobacco Former User  . Quit date: 03/25/2012    Review of Systems  Constitutional: Negative.   HENT: Negative.   Eyes: Negative.   Respiratory: Negative.   Cardiovascular: Negative.   Gastrointestinal: Positive for abdominal   pain.  Genitourinary: Negative.   Musculoskeletal: Negative.   Skin: Negative.   Neurological: Negative.   Endo/Heme/Allergies: Negative.   Psychiatric/Behavioral: Negative.     Objective   Vitals:   04/19/19 1117  BP: 102/70  Pulse: 68  Resp: 16  Temp: 98 F (36.7 C)  SpO2: 97%    Physical Exam Vitals signs reviewed.  Constitutional:      Appearance: Normal appearance. He is not ill-appearing.  HENT:     Head: Normocephalic and atraumatic.  Cardiovascular:     Rate and Rhythm: Normal rate and regular rhythm.     Heart sounds: Normal heart sounds. No murmur. No friction rub. No gallop.   Pulmonary:     Effort: Pulmonary effort is normal. No respiratory distress.     Breath sounds: Normal breath sounds. No stridor. No wheezing, rhonchi or rales.  Abdominal:     General: Abdomen is flat. Bowel sounds are normal. There is no  distension.     Palpations: Abdomen is soft. There is no mass.     Tenderness: There is no abdominal tenderness. There is no guarding or rebound.     Hernia: A hernia is present.     Comments: A reducible right inguinal hernia is present.  Laxity of the left inguinal floor is noted.  Genitourinary:    Comments: Genitourinary examination is within normal limits. Skin:    General: Skin is warm and dry.  Neurological:     Mental Status: He is alert and oriented to person, place, and time.     Assessment  Right inguinal hernia Plan   Patient will call to schedule right inguinal herniorrhaphy with mesh.  The risks and benefits of the procedure including bleeding, infection, mesh use, and the possibility of recurrence of the hernia were fully explained to the patient, who gave informed consent.  

## 2019-04-19 NOTE — Patient Instructions (Signed)
Inguinal Hernia, Adult °An inguinal hernia develops when fat or the intestines push through a weak spot in a muscle where your leg meets your lower abdomen (groin). This creates a bulge. This kind of hernia could also be: °· In your scrotum, if you are male. °· In folds of skin around your vagina, if you are male. °There are three types of inguinal hernias: °· Hernias that can be pushed back into the abdomen (are reducible). This type rarely causes pain. °· Hernias that are not reducible (are incarcerated). °· Hernias that are not reducible and lose their blood supply (are strangulated). This type of hernia requires emergency surgery. °What are the causes? °This condition is caused by having a weak spot in the muscles or tissues in the groin. This weak spot develops over time. The hernia may poke through the weak spot when you suddenly strain your lower abdominal muscles, such as when you: °· Lift a heavy object. °· Strain to have a bowel movement. Constipation can lead to straining. °· Cough. °What increases the risk? °This condition is more likely to develop in: °· Men. °· Pregnant women. °· People who: °? Are overweight. °? Work in jobs that require long periods of standing or heavy lifting. °? Have had an inguinal hernia before. °? Smoke or have lung disease. These factors can lead to long-lasting (chronic) coughing. °What are the signs or symptoms? °Symptoms may depend on the size of the hernia. Often, a small inguinal hernia has no symptoms. Symptoms of a larger hernia may include: °· A lump in the groin area. This is easier to see when standing. It might not be visible when lying down. °· Pain or burning in the groin. This may get worse when lifting, straining, or coughing. °· A dull ache or a feeling of pressure in the groin. °· In men, an unusual lump in the scrotum. °Symptoms of a strangulated inguinal hernia may include: °· A bulge in your groin that is very painful and tender to the touch. °· A bulge  that turns red or purple. °· Fever, nausea, and vomiting. °· Inability to have a bowel movement or to pass gas. °How is this diagnosed? °This condition is diagnosed based on your symptoms, your medical history, and a physical exam. Your health care provider may feel your groin area and ask you to cough. °How is this treated? °Treatment depends on the size of your hernia and whether you have symptoms. If you do not have symptoms, your health care provider may have you watch your hernia carefully and have you come in for follow-up visits. If your hernia is large or if you have symptoms, you may need surgery to repair the hernia. °Follow these instructions at home: °Lifestyle °· Avoid lifting heavy objects. °· Avoid standing for long periods of time. °· Do not use any products that contain nicotine or tobacco, such as cigarettes and e-cigarettes. If you need help quitting, ask your health care provider. °· Maintain a healthy weight. °Preventing constipation °· Take actions to prevent constipation. Constipation leads to straining with bowel movements, which can make a hernia worse or cause a hernia repair to break down. Your health care provider may recommend that you: °? Drink enough fluid to keep your urine pale yellow. °? Eat foods that are high in fiber, such as fresh fruits and vegetables, whole grains, and beans. °? Limit foods that are high in fat and processed sugars, such as fried or sweet foods. °? Take an over-the-counter   or prescription medicine for constipation. °General instructions °· You may try to push the hernia back in place by very gently pressing on it while lying down. Do not try to force the bulge back in if it will not push in easily. °· Watch your hernia for any changes in shape, size, or color. Get help right away if you notice any changes. °· Take over-the-counter and prescription medicines only as told by your health care provider. °· Keep all follow-up visits as told by your health care  provider. This is important. °Contact a health care provider if: °· You have a fever. °· You develop new symptoms. °· Your symptoms get worse. °Get help right away if: °· You have pain in your groin that suddenly gets worse. °· You have a bulge in your groin that: °? Suddenly gets bigger and does not get smaller. °? Becomes red or purple or painful to the touch. °· You are a man and you have a sudden pain in your scrotum, or the size of your scrotum suddenly changes. °· You cannot push the hernia back in place by very gently pressing on it when you are lying down. Do not try to force the bulge back in if it will not push in easily. °· You have nausea or vomiting that does not go away. °· You have a fast heartbeat. °· You cannot have a bowel movement or pass gas. °These symptoms may represent a serious problem that is an emergency. Do not wait to see if the symptoms will go away. Get medical help right away. Call your local emergency services (911 in the [place] °Summary °· An inguinal hernia develops when fat or the intestines push through a weak spot in a muscle where your leg meets your lower abdomen (groin). °· This condition is caused by having a weak spot in muscles or tissue in your groin. °· Symptoms may depend on the size of the hernia, and they may include pain or swelling in your groin. A small inguinal hernia often has no symptoms. °· Treatment may not be needed if you do not have symptoms. If you have symptoms or a large hernia, you may need surgery to repair the hernia. °· Avoid lifting heavy objects. Also avoid standing for long amounts of time. °This information is not intended to replace advice given to you by your health care provider. Make sure you discuss any questions you have with your health care provider. °Document Released: 12/28/2008 Document Revised: 09/12/2017 Document Reviewed: 05/13/2017 °Elsevier Patient Education © 2020 [employer] ° °

## 2019-04-26 ENCOUNTER — Other Ambulatory Visit: Payer: Self-pay

## 2019-04-28 NOTE — H&P (Signed)
Eddie Gutierrez; [id number]; 01/22/1996   HPI Patient is a [age] year old white male who I last saw in 2018 for an inguinal hernia who now presents back with worsening pain and swelling in the groin regions, but primarily the right side.  It is made worse with straining.  No nausea or vomiting have been noted.  The pain does radiate slightly to the testicles.   Past Medical History:  Diagnosis Date  . Anxiety   . Bronchitis, chronic (HCC)   . Colic in infants    first 3 months  . Depression    Depressive D/O Nos  . GI bleed    presumed PUD  . Headache(784.0)   . Low birth weight in full term infant with weight unknown   . Seasonal allergies     Past Surgical History:  Procedure Laterality Date  . BIOPSY  04/09/2018   Procedure: BIOPSY;  Surgeon: Malissa Hippoehman, Najeeb U, MD;  Location: [facility];  Service: Endoscopy;;  rectum  . FLEXIBLE SIGMOIDOSCOPY N/A 04/09/2018   Procedure: FLEXIBLE SIGMOIDOSCOPY WITH PROPOFOL;  Surgeon: Malissa Hippoehman, Najeeb U, MD;  Location: [facility];  Service: Endoscopy;  Laterality: N/A;  12:00    Family History  Problem Relation Age of Onset  . Depression Mother        postpartum depression after births of her two children  . ADD / ADHD Mother   . Depression Maternal Grandmother        takes medication  . ADD / ADHD Father   . OCD Father   . ADD / ADHD Brother   . Alcohol abuse Neg Hx   . Drug abuse Neg Hx   . Anxiety disorder Neg Hx   . Bipolar disorder Neg Hx   . Dementia Neg Hx   . Paranoid behavior Neg Hx   . Schizophrenia Neg Hx   . Seizures Neg Hx   . Sexual abuse Neg Hx   . Physical abuse Neg Hx   . Inflammatory bowel disease Neg Hx   . Colon polyps Neg Hx     Current Outpatient Medications on File Prior to Visit  Medication Sig Dispense Refill  . cetirizine (ZYRTEC) 10 MG tablet Take 10 mg by mouth daily as needed for allergies.    . Clotrimazole (JOCK ITCH EX) Apply 1 application topically daily as needed (jock itch).    . hydrocortisone  (ANUSOL-HC) 25 MG suppository INSERT 1 SUPPOSITORY RECTALLY EVERY DAY AT BEDTIME X14 DAYS 14 suppository 0  . mesalamine (CANASA) 1000 MG suppository Place 1 suppository (1,000 mg total) rectally at bedtime. 30 suppository 0  . Pseudoeph-Doxylamine-DM-APAP (NYQUIL PO) Take 1 Dose by mouth at bedtime as needed (sleep).    . Terbinafine HCl (ATHLETES FOOT EX) Apply 1 application topically daily as needed (athletes foot).     No current facility-administered medications on file prior to visit.     No Known Allergies  Social History   Substance and Sexual Activity  Alcohol Use No  . Alcohol/week: 0.0 standard drinks    Social History   Tobacco Use  Smoking Status Former Smoker  . Packs/day: 0.25  . Years: 1.00  . Pack years: 0.25  . Types: Cigarettes  . Quit date: 04/05/2017  . Years since quitting: 2.0  Smokeless Tobacco Former NeurosurgeonUser  . Quit date: 03/25/2012    Review of Systems  Constitutional: Negative.   HENT: Negative.   Eyes: Negative.   Respiratory: Negative.   Cardiovascular: Negative.   Gastrointestinal: Positive for abdominal  pain.  Genitourinary: Negative.   Musculoskeletal: Negative.   Skin: Negative.   Neurological: Negative.   Endo/Heme/Allergies: Negative.   Psychiatric/Behavioral: Negative.     Objective   Vitals:   04/19/19 1117  BP: 102/70  Pulse: 68  Resp: 16  Temp: 98 F (36.7 C)  SpO2: 97%    Physical Exam Vitals signs reviewed.  Constitutional:      Appearance: Normal appearance. He is not ill-appearing.  HENT:     Head: Normocephalic and atraumatic.  Cardiovascular:     Rate and Rhythm: Normal rate and regular rhythm.     Heart sounds: Normal heart sounds. No murmur. No friction rub. No gallop.   Pulmonary:     Effort: Pulmonary effort is normal. No respiratory distress.     Breath sounds: Normal breath sounds. No stridor. No wheezing, rhonchi or rales.  Abdominal:     General: Abdomen is flat. Bowel sounds are normal. There is no  distension.     Palpations: Abdomen is soft. There is no mass.     Tenderness: There is no abdominal tenderness. There is no guarding or rebound.     Hernia: A hernia is present.     Comments: A reducible right inguinal hernia is present.  Laxity of the left inguinal floor is noted.  Genitourinary:    Comments: Genitourinary examination is within normal limits. Skin:    General: Skin is warm and dry.  Neurological:     Mental Status: He is alert and oriented to person, place, and time.     Assessment  Right inguinal hernia Plan   Patient will call to schedule right inguinal herniorrhaphy with mesh.  The risks and benefits of the procedure including bleeding, infection, mesh use, and the possibility of recurrence of the hernia were fully explained to the patient, who gave informed consent.

## 2019-05-04 ENCOUNTER — Encounter (HOSPITAL_COMMUNITY)
Admission: RE | Admit: 2019-05-04 | Discharge: 2019-05-04 | Disposition: A | Discharge status: Home / Self Care | Payer: PRIVATE HEALTH INSURANCE | Source: Ambulatory Visit | Attending: General Surgery | Admitting: General Surgery

## 2019-05-04 ENCOUNTER — Other Ambulatory Visit: Payer: Self-pay

## 2019-05-04 ENCOUNTER — Encounter (HOSPITAL_COMMUNITY): Payer: Self-pay

## 2019-05-06 ENCOUNTER — Other Ambulatory Visit: Payer: Self-pay

## 2019-05-06 ENCOUNTER — Other Ambulatory Visit (HOSPITAL_COMMUNITY)
Admission: RE | Admit: 2019-05-06 | Discharge: 2019-05-06 | Disposition: A | Discharge status: Home / Self Care | Payer: Self-pay | Source: Ambulatory Visit | Attending: General Surgery | Admitting: General Surgery

## 2019-05-06 DIAGNOSIS — Z20828 Contact with and (suspected) exposure to other viral communicable diseases: Secondary | ICD-10-CM | POA: Diagnosis not present

## 2019-05-06 DIAGNOSIS — Z01812 Encounter for preprocedural laboratory examination: Secondary | ICD-10-CM | POA: Diagnosis present

## 2019-05-06 LAB — SARS CORONAVIRUS 2 (TAT 6-24 HRS): SARS Coronavirus 2: NEGATIVE

## 2019-05-09 ENCOUNTER — Encounter (HOSPITAL_COMMUNITY)
Admission: RE | Disposition: A | Discharge status: Home / Self Care | Payer: Self-pay | Source: Home / Self Care | Attending: General Surgery

## 2019-05-09 ENCOUNTER — Ambulatory Visit (HOSPITAL_COMMUNITY): Payer: Self-pay | Admitting: Certified Registered Nurse Anesthetist

## 2019-05-09 ENCOUNTER — Ambulatory Visit (HOSPITAL_COMMUNITY)
Admission: RE | Admit: 2019-05-09 | Discharge: 2019-05-09 | Disposition: A | Discharge status: Home / Self Care | Payer: PRIVATE HEALTH INSURANCE | Attending: General Surgery | Admitting: General Surgery

## 2019-05-09 DIAGNOSIS — K4090 Unilateral inguinal hernia, without obstruction or gangrene, not specified as recurrent: Secondary | ICD-10-CM | POA: Diagnosis not present

## 2019-05-09 DIAGNOSIS — Z87891 Personal history of nicotine dependence: Secondary | ICD-10-CM | POA: Insufficient documentation

## 2019-05-09 HISTORY — PX: INGUINAL HERNIA REPAIR: SHX194

## 2019-05-09 SURGERY — REPAIR, HERNIA, INGUINAL, ADULT
Anesthesia: General | Site: Groin | Laterality: Right

## 2019-05-09 MED ORDER — LACTATED RINGERS IV SOLN
INTRAVENOUS | Status: DC | PRN
  Administered 2019-05-09: 07:00:00 via INTRAVENOUS

## 2019-05-09 MED ORDER — PROPOFOL 10 MG/ML IV BOLUS
INTRAVENOUS | Status: DC | PRN
  Administered 2019-05-09: 200 mg via INTRAVENOUS

## 2019-05-09 MED ORDER — SUGAMMADEX SODIUM 200 MG/2ML IV SOLN
INTRAVENOUS | Status: DC | PRN
  Administered 2019-05-09: 200 mg via INTRAVENOUS

## 2019-05-09 MED ORDER — CEFAZOLIN SODIUM-DEXTROSE 2-4 GM/100ML-% IV SOLN
INTRAVENOUS | Status: AC
  Filled 2019-05-09: qty 100

## 2019-05-09 MED ORDER — CHLORHEXIDINE GLUCONATE CLOTH 2 % EX PADS: 6.0000 | MEDICATED_PAD | Freq: Once | CUTANEOUS | Status: DC

## 2019-05-09 MED ORDER — MIDAZOLAM HCL 2 MG/2ML IJ SOLN
INTRAMUSCULAR | Status: AC
  Filled 2019-05-09: qty 2

## 2019-05-09 MED ORDER — ROCURONIUM 10MG/ML (10ML) SYRINGE FOR MEDFUSION PUMP - OPTIME
INTRAVENOUS | Status: DC | PRN
  Administered 2019-05-09: 6 mg via INTRAVENOUS

## 2019-05-09 MED ORDER — FENTANYL CITRATE (PF) 250 MCG/5ML IJ SOLN
INTRAMUSCULAR | Status: AC
  Filled 2019-05-09: qty 5

## 2019-05-09 MED ORDER — BUPIVACAINE LIPOSOME 1.3 % IJ SUSP
INTRAMUSCULAR | Status: DC | PRN
  Administered 2019-05-09: 20 mL

## 2019-05-09 MED ORDER — 0.9 % SODIUM CHLORIDE (POUR BTL) OPTIME
TOPICAL | Status: DC | PRN
  Administered 2019-05-09: 1000 mL

## 2019-05-09 MED ORDER — ONDANSETRON HCL 4 MG/2ML IJ SOLN
INTRAMUSCULAR | Status: AC
  Filled 2019-05-09: qty 2

## 2019-05-09 MED ORDER — HYDROMORPHONE HCL 1 MG/ML IJ SOLN: 0.2500 mg | INTRAMUSCULAR | Status: DC | PRN

## 2019-05-09 MED ORDER — FENTANYL CITRATE (PF) 100 MCG/2ML IJ SOLN
INTRAMUSCULAR | Status: DC | PRN
  Administered 2019-05-09 (×7): 50 ug via INTRAVENOUS

## 2019-05-09 MED ORDER — SUCCINYLCHOLINE 20MG/ML (10ML) SYRINGE FOR MEDFUSION PUMP - OPTIME
INTRAMUSCULAR | Status: DC | PRN
  Administered 2019-05-09: 120 mg via INTRAVENOUS

## 2019-05-09 MED ORDER — PROPOFOL 10 MG/ML IV BOLUS
INTRAVENOUS | Status: AC
  Filled 2019-05-09: qty 40

## 2019-05-09 MED ORDER — LACTATED RINGERS IV SOLN
Freq: Once | INTRAVENOUS | Status: AC
  Administered 2019-05-09: 07:00:00 via INTRAVENOUS

## 2019-05-09 MED ORDER — ONDANSETRON HCL 4 MG/2ML IJ SOLN
INTRAMUSCULAR | Status: DC | PRN
  Administered 2019-05-09: 4 mg via INTRAVENOUS

## 2019-05-09 MED ORDER — KETOROLAC TROMETHAMINE 30 MG/ML IJ SOLN
30.0000 mg | Freq: Once | INTRAMUSCULAR | Status: AC
  Administered 2019-05-09: 09:00:00 30 mg via INTRAVENOUS
  Filled 2019-05-09: qty 1

## 2019-05-09 MED ORDER — MIDAZOLAM HCL 2 MG/2ML IJ SOLN
2.0000 mg | Freq: Once | INTRAMUSCULAR | Status: AC
  Administered 2019-05-09: 2 mg via INTRAVENOUS

## 2019-05-09 MED ORDER — ROCURONIUM BROMIDE 10 MG/ML (PF) SYRINGE
PREFILLED_SYRINGE | INTRAVENOUS | Status: AC
  Filled 2019-05-09: qty 10

## 2019-05-09 MED ORDER — OXYCODONE-ACETAMINOPHEN 7.5-325 MG PO TABS: 1.0000 | ORAL_TABLET | Freq: Four times a day (QID) | ORAL | 0 refills | Status: AC | PRN

## 2019-05-09 MED ORDER — MEPERIDINE HCL 50 MG/ML IJ SOLN: 6.2500 mg | INTRAMUSCULAR | Status: DC | PRN

## 2019-05-09 MED ORDER — FENTANYL CITRATE (PF) 100 MCG/2ML IJ SOLN
INTRAMUSCULAR | Status: AC
  Filled 2019-05-09: qty 2

## 2019-05-09 MED ORDER — CEFAZOLIN SODIUM-DEXTROSE 2-4 GM/100ML-% IV SOLN
2.0000 g | INTRAVENOUS | Status: AC
  Administered 2019-05-09: 2 g via INTRAVENOUS

## 2019-05-09 MED ORDER — ONDANSETRON HCL 4 MG/2ML IJ SOLN
4.0000 mg | Freq: Once | INTRAMUSCULAR | Status: AC | PRN
  Administered 2019-05-09: 4 mg via INTRAVENOUS
  Filled 2019-05-09: qty 2

## 2019-05-09 MED ORDER — SUCCINYLCHOLINE CHLORIDE 200 MG/10ML IV SOSY
PREFILLED_SYRINGE | INTRAVENOUS | Status: AC
  Filled 2019-05-09: qty 20

## 2019-05-09 SURGICAL SUPPLY — 45 items
ADH SKN CLS APL DERMABOND .7 (GAUZE/BANDAGES/DRESSINGS) ×1
CLOTH BEACON ORANGE TIMEOUT ST (SAFETY) ×2 IMPLANT
COVER LIGHT HANDLE STERIS (MISCELLANEOUS) ×4 IMPLANT
COVER WAND RF STERILE (DRAPES) ×2 IMPLANT
DERMABOND ADVANCED (GAUZE/BANDAGES/DRESSINGS) ×1
DERMABOND ADVANCED .7 DNX12 (GAUZE/BANDAGES/DRESSINGS) ×1 IMPLANT
DRAIN PENROSE 18X1/2 LTX STRL (DRAIN) ×2 IMPLANT
ELECT REM PT RETURN 9FT ADLT (ELECTROSURGICAL) ×2
ELECTRODE REM PT RTRN 9FT ADLT (ELECTROSURGICAL) ×1 IMPLANT
GAUZE SPONGE 4X4 12PLY STRL (GAUZE/BANDAGES/DRESSINGS) ×2 IMPLANT
GLOVE BIO SURGEON STRL SZ7 (GLOVE) ×1 IMPLANT
GLOVE BIO SURGEON STRL SZ8 (GLOVE) ×1 IMPLANT
GLOVE BIOGEL PI IND STRL 7.0 (GLOVE) ×3 IMPLANT
GLOVE BIOGEL PI IND STRL 7.5 (GLOVE) IMPLANT
GLOVE BIOGEL PI IND STRL 8 (GLOVE) IMPLANT
GLOVE BIOGEL PI INDICATOR 7.0 (GLOVE) ×2
GLOVE BIOGEL PI INDICATOR 7.5 (GLOVE) ×1
GLOVE BIOGEL PI INDICATOR 8 (GLOVE) ×1
GLOVE ECLIPSE 6.5 STRL STRAW (GLOVE) ×1 IMPLANT
GLOVE SURG SS PI 7.5 STRL IVOR (GLOVE) ×2 IMPLANT
GOWN STRL REUS W/TWL LRG LVL3 (GOWN DISPOSABLE) ×7 IMPLANT
INST SET MINOR GENERAL (KITS) ×2 IMPLANT
KIT TURNOVER KIT A (KITS) ×2 IMPLANT
MANIFOLD NEPTUNE II (INSTRUMENTS) ×2 IMPLANT
MESH MARLEX PLUG MEDIUM (Mesh General) ×1 IMPLANT
NDL HYPO 18GX1.5 BLUNT FILL (NEEDLE) ×1 IMPLANT
NDL HYPO 21X1.5 SAFETY (NEEDLE) ×1 IMPLANT
NEEDLE HYPO 18GX1.5 BLUNT FILL (NEEDLE) ×2 IMPLANT
NEEDLE HYPO 21X1.5 SAFETY (NEEDLE) ×2 IMPLANT
NS IRRIG 1000ML POUR BTL (IV SOLUTION) ×2 IMPLANT
PACK MINOR (CUSTOM PROCEDURE TRAY) ×2 IMPLANT
PAD ARMBOARD 7.5X6 YLW CONV (MISCELLANEOUS) ×2 IMPLANT
SET BASIN LINEN APH (SET/KITS/TRAYS/PACK) ×2 IMPLANT
SOL PREP PROV IODINE SCRUB 4OZ (MISCELLANEOUS) ×2 IMPLANT
SUT MNCRL AB 4-0 PS2 18 (SUTURE) ×2 IMPLANT
SUT NOVA NAB GS-22 2 2-0 T-19 (SUTURE) ×5 IMPLANT
SUT PROLENE 2 0 SH 30 (SUTURE) IMPLANT
SUT SILK 3 0 (SUTURE)
SUT SILK 3-0 18XBRD TIE 12 (SUTURE) IMPLANT
SUT VIC AB 2-0 CT1 27 (SUTURE) ×2
SUT VIC AB 2-0 CT1 TAPERPNT 27 (SUTURE) ×1 IMPLANT
SUT VIC AB 3-0 SH 27 (SUTURE) ×2
SUT VIC AB 3-0 SH 27X BRD (SUTURE) ×1 IMPLANT
SUT VICRYL AB 3 0 TIES (SUTURE) IMPLANT
SYR 20ML LL LF (SYRINGE) ×4 IMPLANT

## 2019-05-09 NOTE — Interval H&P Note (Signed)
History and Physical Interval Note:  05/09/2019 7:16 AM  Eddie Gutierrez  has presented today for surgery, with the diagnosis of right inguinal hernia.  The various methods of treatment have been discussed with the patient and family. After consideration of risks, benefits and other options for treatment, the patient has consented to  Procedure(s): HERNIA REPAIR INGUINAL ADULT WITH MESH (Right) as a surgical intervention.  The patient's history has been reviewed, patient examined, no change in status, stable for surgery.  I have reviewed the patient's chart and labs.  Questions were answered to the patient's satisfaction.     Aviva Signs

## 2019-05-09 NOTE — Op Note (Signed)
Patient:  Eddie Gutierrez  DOB:  May 20, 1996  MRN:  [id number]   Preop Diagnosis: Right inguinal hernia  Postop Diagnosis: Same  Procedure: Right inguinal herniorrhaphy with mesh  Surgeon: Aviva Signs, MD  Anes: General  Indications: Patient is a 23 year old white male who presents with a symptomatic right inguinal hernia.  The risks and benefits of the procedure including bleeding, infection, mesh use, and the possibility of recurrence of the hernia were fully explained to the patient, who gave informed consent.  Procedure note: The patient was placed in the supine position.  After general anesthesia was administered, the right groin region was prepped and draped using the usual sterile technique with Betadine.  Surgical site confirmation was performed.  Incision was made in the right groin region down to the external oblique aponeurosis.  The aponeurosis was incised to the external ring.  A Penrose drain was placed around the spermatic cord.  The vas deferens was noted within the spermatic cord.  The ileal inguinal nerve was identified and retracted superiorly from the operative field.  The patient was noted to have a direct hernia.  No indirect hernia was found.  A medium sized Bard PerFix plug was then inserted into the direct hernia.  An onlay patch was placed along the floor of the inguinal canal and secured superiorly to the conjoined tendon and inferiorly to the shelving edge of Poupart's ligament using 2-0 Novafil interrupted sutures.  The internal ring was re-created using a 2-0 Novafil interrupted suture.  The external Bleich aponeurosis was reapproximated using a 2-0 Vicryl running suture.  Subcutaneous layer was reapproximated using a 3-0 Vicryl interrupted suture.  Exparel was instilled into the surrounding wound.  The skin was closed using a 4-0 Monocryl subcuticular suture.  Dermabond was applied.  All tape and needle counts were correct at the end of the procedure.  The patient  was awakened and transferred to PACU in stable condition.  Complications: None  EBL: Minimal  Specimen: None

## 2019-05-09 NOTE — Anesthesia Procedure Notes (Signed)
Procedure Name: Intubation Date/Time: 05/09/2019 7:40 AM Performed by: Ollen Bowl, CRNA Pre-anesthesia Checklist: Patient identified, Patient being monitored, Timeout performed, Emergency Drugs available and Suction available Patient Re-evaluated:Patient Re-evaluated prior to induction Oxygen Delivery Method: Circle system utilized Preoxygenation: Pre-oxygenation with 100% oxygen Induction Type: IV induction Ventilation: Mask ventilation without difficulty Laryngoscope Size: Mac and 3 Grade View: Grade I Tube type: Oral Tube size: 7.0 mm Number of attempts: 1 Airway Equipment and Method: Stylet Placement Confirmation: ETT inserted through vocal cords under direct vision,  positive ETCO2 and breath sounds checked- equal and bilateral Secured at: 23 cm Tube secured with: Tape Dental Injury: Teeth and Oropharynx as per pre-operative assessment

## 2019-05-09 NOTE — Discharge Instructions (Signed)
Open Hernia Repair, Adult, Care After °This sheet gives you information about how to care for yourself after your procedure. Your health care provider may also give you more specific instructions. If you have problems or questions, contact your health care provider. °What can I expect after the procedure? °After the procedure, it is common to have: °· Mild discomfort. °· Slight bruising. °· Minor swelling. °· Pain in the abdomen. °Follow these instructions at home: °Incision care ° °· Follow instructions from your health care provider about how to take care of your incision area. Make sure you: °? Wash your hands with soap and water before you change your bandage (dressing). If soap and water are not available, use hand sanitizer. °? Change your dressing as told by your health care provider. °? Leave stitches (sutures), skin glue, or adhesive strips in place. These skin closures may need to stay in place for 2 weeks or longer. If adhesive strip edges start to loosen and curl up, you may trim the loose edges. Do not remove adhesive strips completely unless your health care provider tells you to do that. °· Check your incision area every day for signs of infection. Check for: °? More redness, swelling, or pain. °? More fluid or blood. °? Warmth. °? Pus or a bad smell. °Activity °· Do not drive or use heavy machinery while taking prescription pain medicine. Do not drive until your health care provider approves. °· Until your health care provider approves: °? Do not lift anything that is heavier than 10 lb (4.5 kg). °? Do not play contact sports. °· Return to your normal activities as told by your health care provider. Ask your health care provider what activities are safe. °General instructions °· To prevent or treat constipation while you are taking prescription pain medicine, your health care provider may recommend that you: °? Drink enough fluid to keep your urine clear or pale yellow. °? Take over-the-counter or  prescription medicines. °? Eat foods that are high in fiber, such as fresh fruits and vegetables, whole grains, and beans. °? Limit foods that are high in fat and processed sugars, such as fried and sweet foods. °· Take over-the-counter and prescription medicines only as told by your health care provider. °· Do not take tub baths or go swimming until your health care provider approves. °· Keep all follow-up visits as told by your health care provider. This is important. °Contact a health care provider if: °· You develop a rash. °· You have more redness, swelling, or pain around your incision. °· You have more fluid or blood coming from your incision. °· Your incision feels warm to the touch. °· You have pus or a bad smell coming from your incision. °· You have a fever or chills. °· You have blood in your stool (feces). °· You have not had a bowel movement in 2-3 days. °· Your pain is not controlled with medicine. °Get help right away if: °· You have chest pain or shortness of breath. °· You feel light-headed or feel faint. °· You have severe pain. °· You vomit and your pain is worse. °This information is not intended to replace advice given to you by your health care provider. Make sure you discuss any questions you have with your health care provider. °Document Released: 02/28/2005 Document Revised: 07/24/2017 Document Reviewed: 01/23/2016 °Elsevier Patient Education © 2020 [employer] ° °

## 2019-05-09 NOTE — Anesthesia Preprocedure Evaluation (Signed)
Anesthesia Evaluation  Patient identified by MRN, date of birth, ID band Patient awake    Reviewed: Allergy & Precautions, NPO status , Patient's Chart, lab work & pertinent test results  History of Anesthesia Complications Negative for: history of anesthetic complications  Airway Mallampati: II  TM Distance: >3 FB Neck ROM: Full    Dental no notable dental hx.    Pulmonary former smoker,    Pulmonary exam normal breath sounds clear to auscultation       Cardiovascular negative cardio ROS Normal cardiovascular exam Rhythm:Regular Rate:Normal     Neuro/Psych  Headaches, PSYCHIATRIC DISORDERS Anxiety Depression    GI/Hepatic negative GI ROS, Neg liver ROS,   Endo/Other  negative endocrine ROS  Renal/GU negative Renal ROS     Musculoskeletal negative musculoskeletal ROS (+)   Abdominal   Peds negative pediatric ROS (+)  Hematology negative hematology ROS (+)   Anesthesia Other Findings   Reproductive/Obstetrics negative OB ROS                             Anesthesia Physical Anesthesia Plan  ASA: II  Anesthesia Plan: General   Post-op Pain Management:    Induction: Intravenous  PONV Risk Score and Plan:   Airway Management Planned: Oral ETT  Additional Equipment:   Intra-op Plan:   Post-operative Plan: Extubation in OR  Informed Consent: I have reviewed the patients History and Physical, chart, labs and discussed the procedure including the risks, benefits and alternatives for the proposed anesthesia with the patient or authorized representative who has indicated his/her understanding and acceptance.     Dental advisory given  Plan Discussed with: CRNA and Surgeon  Anesthesia Plan Comments:         Anesthesia Quick Evaluation

## 2019-05-09 NOTE — Transfer of Care (Signed)
Immediate Anesthesia Transfer of Care Note  Patient: Eddie Gutierrez  Procedure(s) Performed: HERNIA REPAIR INGUINAL ADULT WITH MESH (Right Groin)  Patient Location: PACU  Anesthesia Type:General  Level of Consciousness: awake  Airway & Oxygen Therapy: Patient Spontanous Breathing  Post-op Assessment: Report given to RN  Post vital signs: Reviewed and stable  Last Vitals:  Vitals Value Taken Time  BP 129/69 05/09/19 0845  Temp    Pulse 116 05/09/19 0845  Resp 20 05/09/19 0845  SpO2 98 % 05/09/19 0845  Vitals shown include unvalidated device data.  Last Pain:  Vitals:   05/09/19 0707  TempSrc: Oral  PainSc: 0-No pain      Patients Stated Pain Goal: 5 (52/77/82 4235)  Complications: No apparent anesthesia complications

## 2019-05-09 NOTE — Anesthesia Postprocedure Evaluation (Signed)
Anesthesia Post Note  Patient: Eddie Gutierrez  Procedure(s) Performed: HERNIA REPAIR INGUINAL ADULT WITH MESH (Right Groin)  Patient location during evaluation: PACU Anesthesia Type: General Level of consciousness: awake and alert and oriented Pain management: pain level controlled Vital Signs Assessment: post-procedure vital signs reviewed and stable Respiratory status: spontaneous breathing Cardiovascular status: blood pressure returned to baseline and stable Postop Assessment: no apparent nausea or vomiting Anesthetic complications: no     Last Vitals:  Vitals:   05/09/19 0923 05/09/19 0930  BP:    Pulse: 76 91  Resp: 12 15  Temp:    SpO2: 98% 95%    Last Pain:  Vitals:   05/09/19 0930  TempSrc:   PainSc: 4                  Dmonte Maher

## 2019-05-10 ENCOUNTER — Telehealth: Payer: Self-pay

## 2019-05-10 ENCOUNTER — Encounter (HOSPITAL_COMMUNITY): Payer: Self-pay | Admitting: General Surgery

## 2019-05-10 NOTE — Telephone Encounter (Signed)
Patient's mother, Olegario Shearer, called regarding pain management. Wanted to know if she could give ibuprofen between pain meds, and if so how much?

## 2019-05-16 ENCOUNTER — Encounter: Payer: Self-pay | Admitting: General Surgery

## 2019-05-19 ENCOUNTER — Encounter: Payer: Self-pay | Admitting: General Surgery

## 2019-05-19 ENCOUNTER — Other Ambulatory Visit: Payer: Self-pay

## 2019-05-19 ENCOUNTER — Ambulatory Visit (INDEPENDENT_AMBULATORY_CARE_PROVIDER_SITE_OTHER): Payer: Self-pay | Admitting: General Surgery

## 2019-05-19 VITALS — BP 119/79 | HR 66 | Temp 97.5°F | Resp 18 | Ht 70.0 in | Wt 197.0 lb

## 2019-05-19 DIAGNOSIS — Z09 Encounter for follow-up examination after completed treatment for conditions other than malignant neoplasm: Secondary | ICD-10-CM

## 2019-05-19 NOTE — Progress Notes (Signed)
Subjective:     Eddie Gutierrez  Here for postoperative visit, status post right inguinal herniorrhaphy with mesh.  Patient having intermittent moderate incisional pain, though he is not taking any more Percocet at this time.  He was constipated, but this has resolved. Objective:    BP 119/79 (BP Location: Left Arm, Patient Position: Sitting, Cuff Size: Normal)   Pulse 66   Temp (!) 97.5 F (36.4 C) (Tympanic)   Resp 18   Ht 5\' 10"  (1.778 m)   Wt 197 lb (89.4 kg)   SpO2 98%   BMI 28.27 kg/m   General:  alert, cooperative and no distress  Abdomen is soft, incision healing well.  No hematoma present.     Assessment:    Doing well postoperatively.    Plan:   Patient was instructed to increase activity as able.  He may return as needed.

## 2019-06-14 ENCOUNTER — Other Ambulatory Visit: Payer: Self-pay

## 2019-06-14 ENCOUNTER — Telehealth: Payer: Self-pay | Admitting: General Surgery

## 2019-06-14 ENCOUNTER — Telehealth (INDEPENDENT_AMBULATORY_CARE_PROVIDER_SITE_OTHER): Payer: Self-pay | Admitting: General Surgery

## 2019-06-14 DIAGNOSIS — Z09 Encounter for follow-up examination after completed treatment for conditions other than malignant neoplasm: Secondary | ICD-10-CM

## 2019-06-14 NOTE — Telephone Encounter (Signed)
Patient still having intermittent right groin pain that was present before the surgery.  He started working out and the pain recurred.  I told him that he needs to limit any activity that could aggravate the surgical area.  I also suggested using ibuprofen for pain.  He stated that the pain was intermittent in nature.  Mild swelling is still present, but decreased.  I told him that it can take several months to fully recover from inguinal hernia surgery.  He states that he did understand that.  Reassured patient that this was normal after surgery.  Follow-up as needed.

## 2019-07-16 ENCOUNTER — Telehealth: Payer: Self-pay | Admitting: Nurse Practitioner

## 2019-07-16 DIAGNOSIS — L509 Urticaria, unspecified: Secondary | ICD-10-CM

## 2019-07-16 MED ORDER — PREDNISONE 10 MG (21) PO TBPK: ORAL_TABLET | ORAL | 0 refills | Status: AC

## 2019-07-16 NOTE — Progress Notes (Signed)
E Visit for Rash  We are sorry that you are not feeling well. Here is how we plan to help!   Based on what you shared with me you may have a  an allergic reaction. .    Prednisone 10 mg daily for 6 days (see taper instructions below). You may also take benadryl for the itching.  Directions for 6 day taper: Day 1: 2 tablets before breakfast, 1 after both lunch & dinner and 2 at bedtime Day 2: 1 tab before breakfast, 1 after both lunch & dinner and 2 at bedtime Day 3: 1 tab at each meal & 1 at bedtime Day 4: 1 tab at breakfast, 1 at lunch, 1 at bedtime Day 5: 1 tab at breakfast & 1 tab at bedtime Day 6: 1 tab at breakfast    HOME CARE:   Take cool showers and avoid direct sunlight.  Apply cool compress or wet dressings.  Take a bath in an oatmeal bath.  Sprinkle content of one Aveeno packet under running faucet with comfortably warm water.  Bathe for 15-20 minutes, 1-2 times daily.  Pat dry with a towel. Do not rub the rash.  Use hydrocortisone cream.  Take an antihistamine like Benadryl for widespread rashes that itch.  The adult dose of Benadryl is 25-50 mg by mouth 4 times daily.  Caution:  This type of medication may cause sleepiness.  Do not drink alcohol, drive, or operate dangerous machinery while taking antihistamines.  Do not take these medications if you have prostate enlargement.  Read package instructions thoroughly on all medications that you take.  GET HELP RIGHT AWAY IF:   Symptoms don't go away after treatment.  Severe itching that persists.  If you rash spreads or swells.  If you rash begins to smell.  If it blisters and opens or develops a yellow-brown crust.  You develop a fever.  You have a sore throat.  You become short of breath.  MAKE SURE YOU:  Understand these instructions. Will watch your condition. Will get help right away if you are not doing well or get worse.  Thank you for choosing an e-visit. Your e-visit answers were reviewed  by a board certified advanced clinical practitioner to complete your personal care plan. Depending upon the condition, your plan could have included both over the counter or prescription medications. Please review your pharmacy choice. Be sure that the pharmacy you have chosen is open so that you can pick up your prescription now.  If there is a problem you may message your provider in [place] to have the prescription routed to another pharmacy. Your safety is important to [place]. If you have drug allergies check your prescription carefully.  For the next 24 hours, you can use MyChart to ask questions about today's visit, request a non-urgent call back, or ask for a work or school excuse from your e-visit provider. You will get an email in the next two days asking about your experience. I hope that your e-visit has been valuable and will speed your recovery.    5-10 minutes spent reviewing and documenting in chart.

## 2019-10-10 ENCOUNTER — Ambulatory Visit (INDEPENDENT_AMBULATORY_CARE_PROVIDER_SITE_OTHER): Payer: Self-pay | Admitting: Gastroenterology

## 2020-03-05 ENCOUNTER — Telehealth (HOSPITAL_COMMUNITY): Payer: Self-pay

## 2020-03-05 NOTE — Telephone Encounter (Signed)
Patient called requesting a call back from you. He stated he's having a hard time getting an evaluation for a certificate and would like to speak with you about it. Please call him at [phone]. Please advise.Thank you.

## 2020-03-07 ENCOUNTER — Telehealth (HOSPITAL_COMMUNITY): Payer: Self-pay | Admitting: Psychiatry

## 2020-03-07 NOTE — Telephone Encounter (Signed)
Returned call to patient who is requesting an evaluation for concealed to carry permit.  Advised patient to contact Dr. Kieth Brightly for possible evaluation/possible suggestions for evaluation.  Provided patient with contact information for Dr. Kieth Brightly.

## 2022-12-30 ENCOUNTER — Other Ambulatory Visit: Payer: Self-pay | Admitting: Family Medicine

## 2022-12-30 DIAGNOSIS — N5089 Other specified disorders of the male genital organs: Secondary | ICD-10-CM

## 2023-01-07 ENCOUNTER — Ambulatory Visit
Admission: RE | Admit: 2023-01-07 | Discharge: 2023-01-07 | Disposition: A | Discharge status: Home / Self Care | Payer: Self-pay | Source: Ambulatory Visit | Attending: Family Medicine | Admitting: Family Medicine

## 2023-01-07 DIAGNOSIS — N5089 Other specified disorders of the male genital organs: Secondary | ICD-10-CM

## 2023-03-13 ENCOUNTER — Ambulatory Visit
Admission: EM | Admit: 2023-03-13 | Discharge: 2023-03-13 | Disposition: A | Discharge status: Home / Self Care | Payer: Self-pay

## 2023-03-13 DIAGNOSIS — S39011A Strain of muscle, fascia and tendon of abdomen, initial encounter: Secondary | ICD-10-CM

## 2023-03-13 LAB — POCT URINALYSIS DIP (MANUAL ENTRY)
Bilirubin, UA: NEGATIVE
Blood, UA: NEGATIVE
Glucose, UA: NEGATIVE mg/dL
Ketones, POC UA: NEGATIVE mg/dL
Leukocytes, UA: NEGATIVE
Nitrite, UA: NEGATIVE
Protein Ur, POC: NEGATIVE mg/dL
Spec Grav, UA: 1.01 (ref 1.010–1.025)
Urobilinogen, UA: 0.2 E.U./dL
pH, UA: 6 (ref 5.0–8.0)

## 2023-03-13 NOTE — ED Provider Notes (Signed)
[facility]    CSN: [id number] Arrival date & time: 03/13/23  1234      History   Chief Complaint Chief Complaint  Patient presents with   Abdominal Pain    HPI Eddie Gutierrez is a 27 y.o. male.   The history is provided by the patient.   The patient presents for right lower quadrant abdominal/pelvic pain. Patient states symptoms started 2 days ago when he was doing leg lifts at the gym. He states he felt a sharp pain and felt something "pull". He reports that he can feel the pain when he goes from a sitting to a standing position and with coughing. He reports prior history of right inguinal hernia. Review of the patient's chart shows history of right inguinal pain, ilioinguinal neuralgia, and pelvic floor dysfunction. He has taken Advil for the pain with some relief. He denies bulging of the groin, nausea, vomiting, diarrhea, bloody stools, urinary frequency, urgency, hesitancy or decrease urine stream. He also denies testicular/scrotal pain/swelling.   Past Medical History:  Diagnosis Date   Anxiety    Bronchitis, chronic (HCC)    Colic in infants    first 3 months   Depression    Depressive D/O Nos   GI bleed    presumed PUD   Headache(784.0)    Low birth weight in full term infant with weight unknown    Seasonal allergies     Patient Active Problem List   Diagnosis Date Noted   Right inguinal hernia    Rectal bleeding 03/31/2018   Hematochezia    OCD (obsessive compulsive disorder) 07/08/2012   Depressive disorder, not elsewhere classified 07/30/2011   ADD (attention deficit disorder) 07/30/2011    Past Surgical History:  Procedure Laterality Date   BIOPSY  04/09/2018   Procedure: BIOPSY;  Surgeon: Malissa Hippo, MD;  Location: [facility];  Service: Endoscopy;;  rectum   FLEXIBLE SIGMOIDOSCOPY N/A 04/09/2018   Procedure: FLEXIBLE SIGMOIDOSCOPY WITH PROPOFOL;  Surgeon: Malissa Hippo, MD;  Location: [facility];  Service: Endoscopy;   Laterality: N/A;  12:00   INGUINAL HERNIA REPAIR Right 05/09/2019   Procedure: HERNIA REPAIR INGUINAL ADULT WITH MESH;  Surgeon: Franky Macho, MD;  Location: [facility];  Service: General;  Laterality: Right;       Home Medications    Prior to Admission medications   Medication Sig Start Date End Date Taking? Authorizing Provider  diazepam (VALIUM) 10 MG tablet Take 10 mg by mouth daily as needed. 03/02/23  Yes [provider]  DULoxetine (CYMBALTA) 20 MG capsule Take by mouth. 08/13/22 08/13/23 Yes [provider]  gabapentin (NEURONTIN) 100 MG capsule Take 100 mg by mouth 3 (three) times daily. 10/21/22  Yes [provider]  silodosin (RAPAFLO) 8 MG CAPS capsule  08/01/21  Yes [provider]  zolpidem (AMBIEN) 10 MG tablet Take 10 mg by mouth at bedtime as needed.   Yes [provider]  Clotrimazole (JOCK ITCH EX) Apply 1 application topically daily as needed (jock itch).    [provider]  diphenhydrAMINE HCl (ZZZQUIL) 50 MG/30ML LIQD Take 50 mg by mouth at bedtime as needed (sleep).    [provider]  doxycycline (VIBRAMYCIN) 100 MG capsule Take 100 mg by mouth 2 (two) times daily. 02/23/19   [provider]  Glycine POWD Take 1 Dose by mouth at bedtime.    [provider]  Multiple Vitamin (MULTIVITAMIN WITH MINERALS) TABS tablet Take 1 tablet by mouth  daily.    [provider]  predniSONE (STERAPRED UNI-PAK 21 TAB) 10 MG (21) TBPK tablet As directed x 6 days 07/16/19   Bennie Pierini, FNP    Family History Family History  Problem Relation Age of Onset   Depression Mother        postpartum depression after births of her two children   ADD / ADHD Mother    Depression Maternal Grandmother        takes medication   ADD / ADHD Father    OCD Father    ADD / ADHD Brother    Alcohol abuse Neg Hx    Drug abuse Neg Hx    Anxiety disorder Neg Hx    Bipolar disorder Neg Hx    Dementia Neg Hx     Paranoid behavior Neg Hx    Schizophrenia Neg Hx    Seizures Neg Hx    Sexual abuse Neg Hx    Physical abuse Neg Hx    Inflammatory bowel disease Neg Hx    Colon polyps Neg Hx     Social History Social History   Tobacco Use   Smoking status: Former    Current packs/day: 0.00    Average packs/day: 0.3 packs/day for 1 year (0.3 ttl pk-yrs)    Types: Cigarettes    Start date: 04/05/2016    Quit date: 04/05/2017    Years since quitting: 5.9   Smokeless tobacco: Former    Quit date: 03/25/2012  Vaping Use   Vaping status: Never Used  Substance Use Topics   Alcohol use: No    Alcohol/week: 0.0 standard drinks of alcohol   Drug use: No     Allergies   Adhesive [tape]   Review of Systems Review of Systems Per HPI  Physical Exam Triage Vital Signs ED Triage Vitals  Encounter Vitals Group     BP 03/13/23 1250 122/82     Systolic BP Percentile --      Diastolic BP Percentile --      Pulse Rate 03/13/23 1250 85     Resp 03/13/23 1250 18     Temp 03/13/23 1250 98.3 F (36.8 C)     Temp Source 03/13/23 1250 Oral     SpO2 03/13/23 1250 98 %     Weight --      Height --      Head Circumference --      Peak Flow --      Pain Score 03/13/23 1251 6     Pain Loc --      Pain Education --      Exclude from Growth Chart --    No data found.  Updated Vital Signs BP 122/82 (BP Location: Right Arm)   Pulse 85   Temp 98.3 F (36.8 C) (Oral)   Resp 18   SpO2 98%   Visual Acuity Right Eye Distance:   Left Eye Distance:   Bilateral Distance:    Right Eye Near:   Left Eye Near:    Bilateral Near:     Physical Exam Vitals and nursing note reviewed.  Constitutional:      General: He is not in acute distress.    Appearance: He is well-developed.  HENT:     Head: Normocephalic.  Pulmonary:     Effort: Pulmonary effort is normal.     Breath sounds: Normal breath sounds.  Abdominal:     General: Abdomen is flat. Bowel sounds are normal.  Palpations: Abdomen  is soft.     Tenderness: There is no abdominal tenderness. There is no right CVA tenderness.     Hernia: A hernia is present. Hernia is present in the right inguinal area (no tenderness or swelling noted).  Skin:    General: Skin is warm and dry.  Neurological:     General: No focal deficit present.     Mental Status: He is alert and oriented to person, place, and time.      UC Treatments / Results  Labs (all labs ordered are listed, but only abnormal results are displayed) Labs Reviewed  POCT URINALYSIS DIP (MANUAL ENTRY)    EKG   Radiology No results found.  Procedures Procedures (including critical care time)  Medications Ordered in UC Medications - No data to display  Initial Impression / Assessment and Plan / UC Course  I have reviewed the triage vital signs and the nursing notes.  Pertinent labs & imaging results that were available during my care of the patient were reviewed by me and considered in my medical decision making (see chart for details).  The patient is well-appearing, he is in NAD, vital signs are stable.  Suspect a pulled abdominal muscle given the patient's symptoms. On exam, no tenderness to the right groin, right lower quadrant or pelvis. No bulging of the right groin present. Patient reports discomfort with going from a sitting to standing position and with coughing. He reports pain has improved. Urinalysis was negative. Supportive care recommendations provided to the patient to include continuing OTC pain medication, use of ice or heat, and gentle stretching. Patient advised to stretch before and after exercise. Patient advised to monitor for worsening symptoms, if so, patient advised to go to the ER. Follow-up with PCP if symptoms fail to improve. Patient is in agreement with this plan of care and verbalizes understanding, all questions were answered, patient is stable to discharge.    Final Clinical Impressions(s) / UC Diagnoses   Final  diagnoses:  Abdominal muscle strain, initial encounter     Discharge Instructions      Your urinalysis was normal. Continue OTC medication for pain or discomfort. Use ice or heat as needed. Apply ice for pain or swelling, heat for spasm or stiffness. Apply for 20 minutes, remove for 1 hr, repeat as needed. Refrain from exercise for 1 week. Gentle stretching exercises as tolerated. Make sure to begin stretching before and after exercise. If you notice swelling to the affected area, worsening pain, or you develop new symptoms such as testicular/scrotal pain/swelling or urinary symptoms, please go to the ER immediately. If symptoms fail to improve over the next 1 to 2 weeks, please follow-up with your PCP. Follow-up as needed.      ED Prescriptions   None    PDMP not reviewed this encounter.   Abran Cantor, NP 03/13/23 1338

## 2023-03-13 NOTE — ED Triage Notes (Signed)
Pt states he was doing leg lifts at the gym Wednesday and felt a sharp pain in RLQ and now is having 6/10 pain when moving or coughing, does have a history of inguinal hernia on right side.

## 2023-03-13 NOTE — Discharge Instructions (Signed)
Your urinalysis was normal. Continue OTC medication for pain or discomfort. Use ice or heat as needed. Apply ice for pain or swelling, heat for spasm or stiffness. Apply for 20 minutes, remove for 1 hr, repeat as needed. Refrain from exercise for 1 week. Gentle stretching exercises as tolerated. Make sure to begin stretching before and after exercise. If you notice swelling to the affected area, worsening pain, or you develop new symptoms such as testicular/scrotal pain/swelling or urinary symptoms, please go to the ER immediately. If symptoms fail to improve over the next 1 to 2 weeks, please follow-up with your PCP. Follow-up as needed.
